# Patient Record
Sex: Male | Born: 1945
Health system: Southern US, Community
[De-identification: ages and names within clinical notes are randomized; demographics above are authoritative.]

## PROBLEM LIST (undated history)

## (undated) DIAGNOSIS — R351 Nocturia: Secondary | ICD-10-CM

## (undated) DIAGNOSIS — I10 Essential (primary) hypertension: Secondary | ICD-10-CM

## (undated) DIAGNOSIS — E785 Hyperlipidemia, unspecified: Secondary | ICD-10-CM

## (undated) DIAGNOSIS — Z973 Presence of spectacles and contact lenses: Secondary | ICD-10-CM

## (undated) DIAGNOSIS — K219 Gastro-esophageal reflux disease without esophagitis: Secondary | ICD-10-CM

## (undated) DIAGNOSIS — C61 Malignant neoplasm of prostate: Secondary | ICD-10-CM

## (undated) HISTORY — PX: PROSTATE CRYOABLATION: SUR358

## (undated) HISTORY — PX: COLONOSCOPY: SHX174

---

## 1968-07-01 HISTORY — PX: APPENDECTOMY: SHX54

## 2012-07-01 HISTORY — PX: OTHER SURGICAL HISTORY: SHX169

## 2014-07-18 ENCOUNTER — Other Ambulatory Visit: Payer: Self-pay | Admitting: Nephrology

## 2014-07-18 DIAGNOSIS — N183 Chronic kidney disease, stage 3 unspecified: Secondary | ICD-10-CM

## 2014-07-26 ENCOUNTER — Ambulatory Visit
Admission: RE | Admit: 2014-07-26 | Discharge: 2014-07-26 | Disposition: A | Discharge status: Home / Self Care | Payer: Commercial Managed Care - HMO | Source: Ambulatory Visit | Attending: Nephrology | Admitting: Nephrology

## 2014-07-26 DIAGNOSIS — N183 Chronic kidney disease, stage 3 unspecified: Secondary | ICD-10-CM

## 2014-11-15 ENCOUNTER — Other Ambulatory Visit: Payer: Self-pay | Admitting: Urology

## 2014-11-25 ENCOUNTER — Encounter (HOSPITAL_BASED_OUTPATIENT_CLINIC_OR_DEPARTMENT_OTHER): Payer: Self-pay | Admitting: *Deleted

## 2014-11-30 ENCOUNTER — Encounter (HOSPITAL_BASED_OUTPATIENT_CLINIC_OR_DEPARTMENT_OTHER): Payer: Self-pay | Admitting: *Deleted

## 2014-11-30 NOTE — Progress Notes (Signed)
NPO AFTER MN.  ARRIVE AT 0800.  NEEDS ISTAT AND EKG.

## 2014-12-04 NOTE — H&P (Signed)
Reason For Visit Consult to discuss cryotherapy   Active Problems Problems  1. 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 Rising PSA level (R97.2) 3. History of Surgery Prostate Cryosurgical Ablation  History of Present Illness     69 yo male patient of Dr. Simone Curia presents today for consult to discuss cryotherapy for hx of recurrent prostate cancer.       Adenocarcinoma prostate: His PSA was 12 and in 2010 he was diagnosed with prostate cancer and treated with XRT in [place] , [place]. . In 2013 he had a PSA failure and a 9/14 his PSA had risen to 2.9. A biopsy was performed at that time that revealed Gleason 3+4 = 7 cancer in the right lobe only. A CT scan and bone scan in 2/15 were negative for evidence of metastatic disease and an MRI scan revealed no focal extension and apparent involvement of only the right lobe. His PSA at that time was 6.35 and he underwent cryotherapy on 08/24/13 of the right lobe of his prostate only to try to preserve erectile function. He said he does still get a good erection but is not quite as good as it was when he was younger. He does not have any incontinence but he does have some postvoid dribbling.    TRUS/BX 09/29/14: His prostate only measured 22 cc. I did note calcifications within the prostate but the capsule appeared intact.  Pathology: Adenocarcinoma Gleason score 4+3 = 7, <5% right posterior lateral and Gleason 4+4 = 8, 10% left mid lateral.     PSAs:  2/15 - 6.35  4/15 - 2.27  6/15 - 2.10  10/15 - 2.7  1/16 - 3.2   Past Medical History Problems  1. History of hypercholesterolemia (Z86.39) 2. History of hypertension (Z86.79) 3. Malignant neoplasm of prostate (C61) 4. History of Radiation Therapy  Surgical History Problems  1. History of Biopsy Of The Prostate Needle 2. History of Surgery Prostate Cryosurgical Ablation  Current Meds 1. Gemfibrozil 600 MG Oral Tablet;  Therapy: (Recorded:02Mar2016) to Recorded 2.  Levofloxacin 500 MG Oral Tablet; 1 tablet the day before the procedure, 1 tablet the day of  the procedure, and 1 tablet the day after the procedure;  Therapy: [id number] to (Last Rx:02Mar2016)  Requested for: [id number] Ordered 3. Lisinopril TABS;  Therapy: (Recorded:02Mar2016) to Recorded  Allergies Medication  1. No Known Drug Allergies  Family History Problems  1. Family history of malignant neoplasm (Z80.9) : Mother  Social History Problems    Alcohol use (Z78.9)   Denied: History of Caffeine use   Former smoker [id number])   Retired   Single  Review of Systems Genitourinary system(s) were reviewed and pertinent findings if present are noted and are otherwise negative.  Genitourinary: urinary frequency, nocturia, incontinence, difficulty starting the urinary stream, weak urinary stream, urinary stream starts and stops and initiating urination requires straining.    Vitals Vital Signs [Data Includes: Last 1 Day]  Recorded: [id number] 04:21PM  Blood Pressure: 107 / 70 Temperature: 98.3 F Heart Rate: 92  Physical Exam Constitutional: Well nourished and well developed . No acute distress.  ENT:. The ears and nose are normal in appearance.  Neck: The appearance of the neck is normal and no neck mass is present.  Pulmonary: No respiratory distress and normal respiratory rhythm and effort.  Cardiovascular: Heart rate and rhythm are normal . No peripheral edema.  Abdomen: The abdomen is soft and nontender. No masses are palpated. No CVA tenderness. No hernias are  palpable. No hepatosplenomegaly noted.  Rectal: Rectal exam demonstrates normal sphincter tone. Estimated prostate size is 1+. Normal rectal tone, no rectal masses, prostate is smooth, symmetric and non-tender.  Genitourinary: Examination of the penis demonstrates no discharge, no masses, no lesions and a normal meatus. The scrotum is without lesions. The right epididymis is palpably normal and non-tender. The left  epididymis is palpably normal and non-tender. The right testis is non-tender and without masses. The left testis is non-tender and without masses.  Lymphatics: The femoral and inguinal nodes are not enlarged or tender.  Skin: Normal skin turgor, no visible rash and no visible skin lesions.  Neuro/Psych:. Mood and affect are appropriate.    Results/Data Selected Results  UA With REFLEX 07Apr2016 11:10AM Kathie Rhodes  SPECIMEN TYPE: CLEAN CATCH   Test Name Result Flag Reference  COLOR YELLOW  YELLOW  APPEARANCE CLEAR  CLEAR  SPECIFIC GRAVITY 1.015  1.005-1.030  pH 5.5  5.0-8.0  GLUCOSE NEG mg/dL  NEG  BILIRUBIN NEG  NEG  KETONE NEG mg/dL  NEG  BLOOD MOD A NEG  PROTEIN NEG mg/dL  NEG  UROBILINOGEN 0.2 mg/dL  0.0-1.0  NITRITE NEG  NEG  LEUKOCYTE ESTERASE NEG  NEG  SQUAMOUS EPITHELIAL/HPF RARE  RARE  WBC 0-2 WBC/hpf  <3  RBC 3-6 RBC/hpf A <3  BACTERIA NONE SEEN  RARE  CRYSTALS NONE SEEN  NONE SEEN  CASTS NONE SEEN  NONE SEEN   Assessment Assessed  1. 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 Rising PSA level (R97.2) 3. History of Surgery Prostate Cryosurgical Ablation  Recurrent PSA elevation. He has biochemical reactivation of disease. He has Gleason 4+4=8 CaP, in the L lateral lobe. He received cryotherapy on the Right side only, in order to save his erections.    We have discussed his prostate cancer and his alternatives for Rx. He wants to have repeat cryotherapy, understanding that he will lose his ability to have spontaneous erections. He is post Radiation therapy, and there is some concern for fistula formation. However, alternative would be more radiation, which he has already failed.   Plan Outpatient repeat cryotherapy of small gland.   Discussion/Summary cc: Dr. Lyman Speller Electronically signed by : Carolan Clines, M.D.; Nov 14 2014  4:56PM EST

## 2014-12-05 ENCOUNTER — Encounter (HOSPITAL_BASED_OUTPATIENT_CLINIC_OR_DEPARTMENT_OTHER): Payer: Self-pay | Admitting: Anesthesiology

## 2014-12-05 ENCOUNTER — Ambulatory Visit (HOSPITAL_BASED_OUTPATIENT_CLINIC_OR_DEPARTMENT_OTHER): Payer: Medicare HMO | Admitting: Anesthesiology

## 2014-12-05 ENCOUNTER — Encounter (HOSPITAL_COMMUNITY)
Admission: RE | Disposition: A | Discharge status: Home / Self Care | Payer: Self-pay | Source: Ambulatory Visit | Attending: Urology

## 2014-12-05 ENCOUNTER — Observation Stay (HOSPITAL_BASED_OUTPATIENT_CLINIC_OR_DEPARTMENT_OTHER)
Admission: RE | Admit: 2014-12-05 | Discharge: 2014-12-06 | Disposition: A | Discharge status: Home / Self Care | Payer: Medicare HMO | Source: Ambulatory Visit | Attending: Urology | Admitting: Urology

## 2014-12-05 DIAGNOSIS — Z79899 Other long term (current) drug therapy: Secondary | ICD-10-CM | POA: Insufficient documentation

## 2014-12-05 DIAGNOSIS — Z923 Personal history of irradiation: Secondary | ICD-10-CM | POA: Diagnosis not present

## 2014-12-05 DIAGNOSIS — C61 Malignant neoplasm of prostate: Secondary | ICD-10-CM | POA: Diagnosis not present

## 2014-12-05 DIAGNOSIS — I10 Essential (primary) hypertension: Secondary | ICD-10-CM | POA: Insufficient documentation

## 2014-12-05 DIAGNOSIS — Z87891 Personal history of nicotine dependence: Secondary | ICD-10-CM | POA: Insufficient documentation

## 2014-12-05 DIAGNOSIS — E780 Pure hypercholesterolemia: Secondary | ICD-10-CM | POA: Insufficient documentation

## 2014-12-05 HISTORY — DX: Hyperlipidemia, unspecified: E78.5

## 2014-12-05 HISTORY — DX: Gastro-esophageal reflux disease without esophagitis: K21.9

## 2014-12-05 HISTORY — PX: CRYOABLATION: SHX1415

## 2014-12-05 HISTORY — DX: Malignant neoplasm of prostate: C61

## 2014-12-05 HISTORY — DX: Presence of spectacles and contact lenses: Z97.3

## 2014-12-05 HISTORY — DX: Essential (primary) hypertension: I10

## 2014-12-05 HISTORY — DX: Nocturia: R35.1

## 2014-12-05 LAB — POCT I-STAT 4, (NA,K, GLUC, HGB,HCT)
Glucose, Bld: 78 mg/dL (ref 65–99)
HCT: 44 % (ref 39.0–52.0)
Hemoglobin: 15 g/dL (ref 13.0–17.0)
POTASSIUM: 4.1 mmol/L (ref 3.5–5.1)
Sodium: 140 mmol/L (ref 135–145)

## 2014-12-05 SURGERY — CRYOABLATION, PROSTATE
Anesthesia: General | Site: Prostate

## 2014-12-05 MED ORDER — CEFAZOLIN SODIUM-DEXTROSE 2-3 GM-% IV SOLR
INTRAVENOUS | Status: AC
  Filled 2014-12-05: qty 50

## 2014-12-05 MED ORDER — ONDANSETRON HCL 4 MG/2ML IJ SOLN
INTRAMUSCULAR | Status: DC | PRN
  Administered 2014-12-05: 4 mg via INTRAVENOUS

## 2014-12-05 MED ORDER — TRAMADOL HCL 50 MG PO TABS
50.0000 mg | ORAL_TABLET | Freq: Four times a day (QID) | ORAL | Status: DC | PRN
  Administered 2014-12-05: 50 mg via ORAL
  Filled 2014-12-05: qty 1

## 2014-12-05 MED ORDER — DEXAMETHASONE SODIUM PHOSPHATE 10 MG/ML IJ SOLN
INTRAMUSCULAR | Status: DC | PRN
  Administered 2014-12-05: 10 mg via INTRAVENOUS

## 2014-12-05 MED ORDER — MEPERIDINE HCL 25 MG/ML IJ SOLN: 6.2500 mg | INTRAMUSCULAR | Status: DC | PRN

## 2014-12-05 MED ORDER — HYDROMORPHONE HCL 1 MG/ML IJ SOLN: 0.2500 mg | INTRAMUSCULAR | Status: AC | PRN

## 2014-12-05 MED ORDER — DIPHENHYDRAMINE HCL 50 MG/ML IJ SOLN: 12.5000 mg | Freq: Four times a day (QID) | INTRAMUSCULAR | Status: DC | PRN

## 2014-12-05 MED ORDER — PHENAZOPYRIDINE HCL 200 MG PO TABS: 200.0000 mg | ORAL_TABLET | Freq: Three times a day (TID) | ORAL | Status: DC | PRN

## 2014-12-05 MED ORDER — FENTANYL CITRATE (PF) 100 MCG/2ML IJ SOLN
INTRAMUSCULAR | Status: AC
  Filled 2014-12-05: qty 8

## 2014-12-05 MED ORDER — TRIMETHOPRIM 100 MG PO TABS: 100.0000 mg | ORAL_TABLET | ORAL | Status: DC

## 2014-12-05 MED ORDER — TRAMADOL HCL 50 MG PO TABS
ORAL_TABLET | ORAL | Status: AC
  Filled 2014-12-05: qty 1

## 2014-12-05 MED ORDER — ONDANSETRON HCL 4 MG/2ML IJ SOLN: 4.0000 mg | Freq: Once | INTRAMUSCULAR | Status: AC | PRN

## 2014-12-05 MED ORDER — OXYBUTYNIN CHLORIDE 5 MG PO TABS: 5.0000 mg | ORAL_TABLET | Freq: Three times a day (TID) | ORAL | Status: DC | PRN

## 2014-12-05 MED ORDER — TRAMADOL-ACETAMINOPHEN 37.5-325 MG PO TABS: 1.0000 | ORAL_TABLET | Freq: Four times a day (QID) | ORAL | Status: DC | PRN

## 2014-12-05 MED ORDER — FENTANYL CITRATE (PF) 100 MCG/2ML IJ SOLN
INTRAMUSCULAR | Status: DC | PRN
  Administered 2014-12-05: 50 ug via INTRAVENOUS
  Administered 2014-12-05 (×6): 25 ug via INTRAVENOUS

## 2014-12-05 MED ORDER — STERILE WATER FOR IRRIGATION IR SOLN
Status: DC | PRN
  Administered 2014-12-05: 3000 mL

## 2014-12-05 MED ORDER — LIDOCAINE HCL (CARDIAC) 20 MG/ML IV SOLN
INTRAVENOUS | Status: DC | PRN
  Administered 2014-12-05: 100 mg via INTRAVENOUS

## 2014-12-05 MED ORDER — PHENAZOPYRIDINE HCL 100 MG PO TABS
ORAL_TABLET | ORAL | Status: AC
  Filled 2014-12-05: qty 2

## 2014-12-05 MED ORDER — CEFAZOLIN SODIUM-DEXTROSE 2-3 GM-% IV SOLR
2.0000 g | INTRAVENOUS | Status: AC
  Administered 2014-12-05: 2 g via INTRAVENOUS
  Filled 2014-12-05: qty 50

## 2014-12-05 MED ORDER — KETOROLAC TROMETHAMINE 30 MG/ML IJ SOLN
INTRAMUSCULAR | Status: DC | PRN
  Administered 2014-12-05: 30 mg via INTRAVENOUS

## 2014-12-05 MED ORDER — MIDAZOLAM HCL 5 MG/5ML IJ SOLN
INTRAMUSCULAR | Status: DC | PRN
  Administered 2014-12-05 (×2): 1 mg via INTRAVENOUS

## 2014-12-05 MED ORDER — DIPHENHYDRAMINE HCL 12.5 MG/5ML PO ELIX: 12.5000 mg | ORAL_SOLUTION | Freq: Four times a day (QID) | ORAL | Status: DC | PRN

## 2014-12-05 MED ORDER — LACTATED RINGERS IV SOLN
INTRAVENOUS | Status: DC
  Administered 2014-12-05 (×3): via INTRAVENOUS
  Filled 2014-12-05: qty 1000

## 2014-12-05 MED ORDER — SENNA 8.6 MG PO TABS
1.0000 | ORAL_TABLET | Freq: Two times a day (BID) | ORAL | Status: DC
  Administered 2014-12-05 – 2014-12-06 (×2): 8.6 mg via ORAL
  Filled 2014-12-05 (×2): qty 1

## 2014-12-05 MED ORDER — KETOROLAC TROMETHAMINE 15 MG/ML IJ SOLN: 15.0000 mg | Freq: Four times a day (QID) | INTRAMUSCULAR | Status: DC

## 2014-12-05 MED ORDER — EPHEDRINE SULFATE 50 MG/ML IJ SOLN
INTRAMUSCULAR | Status: DC | PRN
  Administered 2014-12-05 (×2): 10 mg via INTRAVENOUS

## 2014-12-05 MED ORDER — SODIUM CHLORIDE 0.45 % IV SOLN: INTRAVENOUS | Status: DC

## 2014-12-05 MED ORDER — CIPROFLOXACIN HCL 250 MG PO TABS
250.0000 mg | ORAL_TABLET | Freq: Two times a day (BID) | ORAL | Status: DC
  Administered 2014-12-05 – 2014-12-06 (×2): 250 mg via ORAL
  Filled 2014-12-05 (×2): qty 1

## 2014-12-05 MED ORDER — TAMSULOSIN HCL 0.4 MG PO CAPS: 0.4000 mg | ORAL_CAPSULE | Freq: Every day | ORAL | Status: DC

## 2014-12-05 MED ORDER — METOCLOPRAMIDE HCL 5 MG/ML IJ SOLN
INTRAMUSCULAR | Status: DC | PRN
  Administered 2014-12-05: 10 mg via INTRAVENOUS

## 2014-12-05 MED ORDER — BELLADONNA ALKALOIDS-OPIUM 16.2-60 MG RE SUPP
RECTAL | Status: DC | PRN
  Administered 2014-12-05: 1 via RECTAL

## 2014-12-05 MED ORDER — PROPOFOL 10 MG/ML IV BOLUS
INTRAVENOUS | Status: DC | PRN
  Administered 2014-12-05: 280 mg via INTRAVENOUS

## 2014-12-05 MED ORDER — MIDAZOLAM HCL 2 MG/2ML IJ SOLN
INTRAMUSCULAR | Status: AC
  Filled 2014-12-05: qty 2

## 2014-12-05 MED ORDER — ACETAMINOPHEN 325 MG PO TABS: 650.0000 mg | ORAL_TABLET | ORAL | Status: DC | PRN

## 2014-12-05 MED ORDER — BELLADONNA ALKALOIDS-OPIUM 16.2-60 MG RE SUPP
RECTAL | Status: AC
Start: 2014-12-05 — End: 2014-12-05
  Filled 2014-12-05: qty 1

## 2014-12-05 MED ORDER — PHENAZOPYRIDINE HCL 200 MG PO TABS
200.0000 mg | ORAL_TABLET | Freq: Three times a day (TID) | ORAL | Status: DC
  Administered 2014-12-05 – 2014-12-06 (×4): 200 mg via ORAL
  Filled 2014-12-05 (×6): qty 1

## 2014-12-05 MED ORDER — BACITRACIN-NEOMYCIN-POLYMYXIN 400-5-5000 EX OINT: 1.0000 "application " | TOPICAL_OINTMENT | Freq: Three times a day (TID) | CUTANEOUS | Status: DC | PRN

## 2014-12-05 MED ORDER — ACETAMINOPHEN 10 MG/ML IV SOLN
INTRAVENOUS | Status: DC | PRN
  Administered 2014-12-05: 1000 mg via INTRAVENOUS

## 2014-12-05 SURGICAL SUPPLY — 40 items
BAG URINE DRAINAGE (UROLOGICAL SUPPLIES) ×3 IMPLANT
BAG URINE LEG 19OZ MD ST LTX (BAG) IMPLANT
BLADE CLIPPER SURG (BLADE) ×3 IMPLANT
BNDG CONFORM 3 STRL LF (GAUZE/BANDAGES/DRESSINGS) IMPLANT
BOOTIES KNEE HIGH SLOAN (MISCELLANEOUS) ×3 IMPLANT
CANISTER SUCTION 2500CC (MISCELLANEOUS) IMPLANT
CATH FOLEY 2WAY SLVR  5CC 18FR (CATHETERS) ×2
CATH FOLEY 2WAY SLVR 5CC 18FR (CATHETERS) ×1 IMPLANT
CHARGE TECH PROCEDURE ONCURA (LABOR (TRAVEL & OVERTIME)) ×3 IMPLANT
CLOTH BEACON ORANGE TIMEOUT ST (SAFETY) ×3 IMPLANT
COVER BACK TABLE 60X90IN (DRAPES) IMPLANT
COVER MAYO STAND STRL (DRAPES) ×3 IMPLANT
DRAPE INCISE IOBAN 66X45 STRL (DRAPES) ×3 IMPLANT
DRAPE LG THREE QUARTER DISP (DRAPES) ×3 IMPLANT
DRAPE UNDERBUTTOCKS STRL (DRAPE) ×3 IMPLANT
DRSG TEGADERM 4X4.75 (GAUZE/BANDAGES/DRESSINGS) ×3 IMPLANT
DRSG TEGADERM 8X12 (GAUZE/BANDAGES/DRESSINGS) ×3 IMPLANT
GAS ARGON HIGH PRESSURE (MEDICAL GASES) ×3 IMPLANT
GAS HELIUM HIGH PRESSURE (MEDICAL GASES) ×3 IMPLANT
GAUZE SPONGE 4X4 12PLY STRL (GAUZE/BANDAGES/DRESSINGS) IMPLANT
GLOVE BIO SURGEON STRL SZ 6.5 (GLOVE) ×2 IMPLANT
GLOVE BIO SURGEON STRL SZ7.5 (GLOVE) ×3 IMPLANT
GLOVE BIO SURGEONS STRL SZ 6.5 (GLOVE) ×1
GLOVE BIOGEL PI IND STRL 6.5 (GLOVE) ×1 IMPLANT
GLOVE BIOGEL PI INDICATOR 6.5 (GLOVE) ×2
GOWN STRL REUS W/TWL LRG LVL3 (GOWN DISPOSABLE) ×3 IMPLANT
GOWN STRL REUS W/TWL XL LVL3 (GOWN DISPOSABLE) ×3 IMPLANT
GUIDEWIRE SUPER STIFF (WIRE) ×3 IMPLANT
HOLDER FOLEY CATH W/STRAP (MISCELLANEOUS) ×3 IMPLANT
KIT CRYO ENDOCARE (DISPOSABLE) ×3 IMPLANT
KIT ICE ROD PROCEDURE PRECISE (DISPOSABLE) IMPLANT
KIT PROSTATE PRESICE I (KITS) IMPLANT
NEEDLE SPNL 18GX3.5 QUINCKE PK (NEEDLE) IMPLANT
PACK CYSTO (CUSTOM PROCEDURE TRAY) ×3 IMPLANT
PLUG CATH AND CAP STER (CATHETERS) ×3 IMPLANT
SPONGE GAUZE 4X4 12PLY STER LF (GAUZE/BANDAGES/DRESSINGS) ×3 IMPLANT
SYRINGE 10CC LL (SYRINGE) IMPLANT
SYRINGE IRR TOOMEY STRL 70CC (SYRINGE) IMPLANT
UNDERPAD 30X30 INCONTINENT (UNDERPADS AND DIAPERS) ×3 IMPLANT
WATER STERILE IRR 500ML POUR (IV SOLUTION) ×3 IMPLANT

## 2014-12-05 NOTE — H&P (View-Only) (Signed)
NPO AFTER MN.  ARRIVE AT 0800.  NEEDS ISTAT AND EKG.

## 2014-12-05 NOTE — Progress Notes (Signed)
Anesthesia was contacted at 4pm all attempts to get a ride home was tried with no one available.  There was a plan for his daughter to pick up that did not pan out.  Dr.Denenny stated that the patient can not be released to go home in a taxi.Dr.Tannenbaum was contacted and stated that he would admit the patient for overnight.  The patients ex-wife was notified that he would be admitted and the patient spoke with her.patient was taken to room 1436 for overnight stay.

## 2014-12-05 NOTE — Interval H&P Note (Signed)
History and Physical Interval Note:  12/05/2014 9:45 AM  James [name]  has presented today for surgery, with the diagnosis of RECURENT PROSTATE CANCER  The various methods of treatment have been discussed with the patient and family. After consideration of risks, benefits and other options for treatment, the patient has consented to  Procedure(s): CRYO ABLATION PROSTATE (N/A) as a surgical intervention .  The patient's history has been reviewed, patient examined, no change in status, stable for surgery.  I have reviewed the patient's chart and labs.  Questions were answered to the patient's satisfaction.     Carilyn Woolston I Colum Colt

## 2014-12-05 NOTE — Anesthesia Preprocedure Evaluation (Signed)
Anesthesia Evaluation  Patient identified by MRN, date of birth, ID band Patient awake    Reviewed: Allergy & Precautions, NPO status , Patient's Chart, lab work & pertinent test results  Airway Mallampati: I  TM Distance: >3 FB Neck ROM: Full    Dental   Pulmonary former smoker,    Pulmonary exam normal       Cardiovascular hypertension, Pt. on medications Normal cardiovascular exam    Neuro/Psych    GI/Hepatic GERD-  Controlled and Medicated,  Endo/Other    Renal/GU      Musculoskeletal   Abdominal   Peds  Hematology   Anesthesia Other Findings   Reproductive/Obstetrics                             Anesthesia Physical Anesthesia Plan  ASA: II  Anesthesia Plan: General   Post-op Pain Management:    Induction: Intravenous  Airway Management Planned: LMA  Additional Equipment:   Intra-op Plan:   Post-operative Plan: Extubation in OR  Informed Consent: I have reviewed the patients History and Physical, chart, labs and discussed the procedure including the risks, benefits and alternatives for the proposed anesthesia with the patient or authorized representative who has indicated his/her understanding and acceptance.     Plan Discussed with: CRNA and Surgeon  Anesthesia Plan Comments:         Anesthesia Quick Evaluation

## 2014-12-05 NOTE — Progress Notes (Signed)
Patients daughter was called to pick up patient.  Her mother communicated the information to his daughter.  She was not here to take him home.  Trying to find a ride home.  Daughter unable come before 5:30 or 6:00 if then.

## 2014-12-05 NOTE — Anesthesia Postprocedure Evaluation (Signed)
Anesthesia Post Note  Patient: James [name]  Procedure(s) Performed: Procedure(s) (LRB): CRYO ABLATION PROSTATE (N/A)  Anesthesia type: general  Patient location: PACU  Post pain: Pain level controlled  Post assessment: Patient's Cardiovascular Status Stable  Last Vitals:  Filed Vitals:   12/05/14 1215  BP: 152/76  Pulse: 66  Temp:   Resp: 12    Post vital signs: Reviewed and stable  Level of consciousness: sedated  Complications: No apparent anesthesia complications

## 2014-12-05 NOTE — Anesthesia Procedure Notes (Signed)
Procedure Name: LMA Insertion Date/Time: 12/05/2014 10:01 AM Performed by: Justice Rocher Pre-anesthesia Checklist: Patient identified, Emergency Drugs available, Suction available and Patient being monitored Patient Re-evaluated:Patient Re-evaluated prior to inductionOxygen Delivery Method: Circle System Utilized Preoxygenation: Pre-oxygenation with 100% oxygen Intubation Type: IV induction Ventilation: Mask ventilation without difficulty LMA: LMA inserted LMA Size: 5.0 Number of attempts: 1 Airway Equipment and Method: Bite block Placement Confirmation: positive ETCO2 Tube secured with: Tape Dental Injury: Teeth and Oropharynx as per pre-operative assessment

## 2014-12-05 NOTE — Transfer of Care (Signed)
Immediate Anesthesia Transfer of Care Note  Patient: James [name]  Procedure(s) Performed: Procedure(s) (LRB): CRYO ABLATION PROSTATE (N/A)  Patient Location: PACU  Anesthesia Type: General  Level of Consciousness: awake, sedated, patient cooperative and responds to stimulation  Airway & Oxygen Therapy: Patient Spontanous Breathing and Patient connected to face mask oxygen  Post-op Assessment: Report given to PACU RN, Post -op Vital signs reviewed and stable and Patient moving all extremities  Post vital signs: Reviewed and stable  Complications: No apparent anesthesia complications

## 2014-12-05 NOTE — Discharge Instructions (Addendum)
INSTRUCTIONS AFTER CRYOABLATION OF THE PROSTATE  Normal Findings After Cryoablation:   You may experience a number of symptoms after the cryoablation which occur in  some patients.  There is no cause for alarm should this happen.  These    symptoms include:  -Blood in the urine.  When you are discharged after your surgery, your urine  will have some blood in it and may appear red.  This occurs to some degree in all patients after cryoablation and is not cause for alarm.  Your urine should clear approximately 24 hours after the procedure, but may persist for quite a while longer.  -Scrotal and penile swelling and bruising.  This occurs about 2-3 days after  the cryoablation and is caused by tissue swelling which temporarily blocks the drainage of lymph.  This is painless and resolves in less than one week.  Ice packs and lying down for short periods of time during the day will improve the swelling.  -Small amounts of bloody discharge from the end of your penis.  This can  occur for up to 6 weeks after the procedure and is not cause for alarm.  It is due to some discharge from the urethra in the area of the prostate.  -Some numbness in the head of the penis.  Occasionally, when a large amount of freezing has been performed during the procedure, the nerve which supplies sensation to one or both sides of the head of the penis may be affected. The sensation returns after a number of months.  Call your doctor at [phone] if any of the following occur:               -If you have any pain, fever, or chills.  -If your foley catheter or suprapubic tube is not draining urine.  -If there is decreasing urinary stream.  This may indicate sloughing of some  dead tissue in the area of the prostate near the opening to the bladder.  It may clear on its own but,  if the problem becomes severe, it may require removal of the dead tissue through a cystoscope.  -If you have diarrhea after urination or foul-smelling  urine.  These  Symptoms may indicate an urethrorectal fistula, which is a hole between the bladder channel and the rectum.  This should be investigated by your doctor.  -If you have any questions or problems.   Diet:  Resume your normal diet.   If you become constipated, you may try  over-the-counter remedies such as Milk of Magnesia.  If you are nauseated, vomiting or feel bloated, notify your doctor's office.  DO NOT GIVE YOURSELF ANY ENEMAS OR RECTAL MEDICATIONS.  THE RECTAL WALL IS THIN AFTER CRYOABLATION.  Activity:   -You may have swelling and bruising of the penis and scrotum.  Apply ice packs   to the area behind the scrotum intermittently for 24-48 hours after your surgery to keep the swelling down.  Wearing an athletic supporter (jock strap) might also help.  -Lying flat on your back will also help decrease the swelling.  You may find   when you are sitting up or walking around that the swelling increases.   -You will have  puncture wounds behind your scrotum making it painful to sit   down.  Use a hemorrhoid donut to sit on if needed.  -You may shower on the second day after surgery.  -There are no lifting or driving restrictions.  Use caution while the suprapubic tube  is in place.   Medications:  -You may resume your preoperative medications, except aspirin and other blood   thinning agents.  Unless otherwise instructed, resume your aspirin after your suprapubic tube is removed.  If you are taking Coumadin or other blood thinners, please discuss when to restart these medications with your surgeon.  -Unless otherwise ordered, you will be given prescriptions for the following   medications which you will need to take after your procedure:   *Antibiotics -  to prevent infection while your suprapubic tube is in place.   *Anti-inflammatory - to prevent pain and to decrease the inflammation in  the area of the prostate.  You may take ibuprofen (Motrin, Advil),acetaminophen (Tylenol) or  naproxen (Aleve) as needed for discomfort.   Call your doctor's office at [phone] for an appointment.  Patient Signature:  ________________________________________________________  Nurse's Signature:  ________________________________________________________    Post Anesthesia Home Care Instructions  Activity: Get plenty of rest for the remainder of the day. A responsible adult should stay with you for 24 hours following the procedure.  For the next 24 hours, DO NOT: -Drive a car -Paediatric nurse -Drink alcoholic beverages -Take any medication unless instructed by your physician -Make any legal decisions or sign important papers.  Meals: Start with liquid foods such as gelatin or soup. Progress to regular foods as tolerated. Avoid greasy, spicy, heavy foods. If nausea and/or vomiting occur, drink only clear liquids until the nausea and/or vomiting subsides. Call your physician if vomiting continues.  Special Instructions/Symptoms: Your throat may feel dry or sore from the anesthesia or the breathing tube placed in your throat during surgery. If this causes discomfort, gargle with warm salt water. The discomfort should disappear within 24 hours.  If you had a scopolamine patch placed behind your ear for the management of post- operative nausea and/or vomiting:  1. The medication in the patch is effective for 72 hours, after which it should be removed.  Wrap patch in a tissue and discard in the trash. Wash hands thoroughly with soap and water. 2. You may remove the patch earlier than 72 hours if you experience unpleasant side effects which may include dry mouth, dizziness or visual disturbances. 3. Avoid touching the patch. Wash your hands with soap and water after contact with the patch.

## 2014-12-05 NOTE — Op Note (Signed)
Pre-operative diagnosis :   Recurrent Adenocarcinoma Prostate  Postoperative diagnosis: Recurrent  Adenocarcinoma Prostate  Operation: Cryotherapy of the Prostate  Surgeon:  S. Gaynelle Arabian, MD  First assistant:  None  Anesthesia: General LMA  Preparation:  After appropriate preanesthesia, the James [name] was brought to the operative room, placed on the operating table in the dorsal supine position where general LMA anesthesia was introduced. He was then replaced in the dorsal lithotomy position with the pubis was prepped with Betadine solution and draped in usual fashion. The history was reviewed. The armband was double checked.  Review history:  Active Problems Problems  1. 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 Rising PSA level (R97.2) 3. History of Surgery Prostate Cryosurgical Ablation  History of Present Illness    69 yo male James [name] of Dr. Simone Curia presents today for consult to discuss cryotherapy for hx of recurrent prostate cancer.     Adenocarcinoma prostate: His PSA was 12 and in 2010 he was diagnosed with prostate cancer and treated with XRT in [place] , [place]. . In 2013 he had a PSA failure and a 9/14 his PSA had risen to 2.9. A biopsy was performed at that time that revealed Gleason 3+4 = 7 cancer in the right lobe only. A CT scan and bone scan in 2/15 were negative for evidence of metastatic disease and an MRI scan revealed no focal extension and apparent involvement of only the right lobe. His PSA at that time was 6.35 and he underwent cryotherapy on 08/24/13 of the right lobe of his prostate only to try to preserve erectile function. He said he does still get a good erection but is not quite as good as it was when he was younger. He does not have any incontinence but he does have some postvoid dribbling.    TRUS/BX 09/29/14: His prostate only measured 22 cc. I did note calcifications within the prostate but the capsule appeared intact.  Pathology: Adenocarcinoma  Gleason score 4+3 = 7, <5% right posterior lateral and Gleason 4+4 = 8, James% left mid lateral.  Statement of  Likelihood of Success: Excellent. TIME-OUT observed.:  Procedure:  With the transrectal ultrasound probe placed, multiple cryotherapy probes were placed in the prostate, in order to ensure that 5 different levels of cryotherapy could be obtained and individually treated.  Temperature probes were placed in the perirectal fascia and an Denonvilliers' fascia. Cystourethroscopy with a copy is with the Flexible cystoscope, to ensure that no probes penetrating the urethra or the bladder. Following cystoscopy, a 0.038 guidewire was passed through the scope into the bladder and coiled. Over the wire, a urethral warming device was placed under ultrasound control. The wire was removed, and the  warming device was secured in place.  5 different rows of cryoprobes were placed, and temperature probes were placed in the perirectal fascia, and Denonvilliers' fascia. The James [name] then underwent controlled cryotherapy of the prostate, with 2 separate freeze/thaw cycles. The second cycle, used a passive thaw. The James [name] tolerated the procedure well, and was given IV Toradol, and IV Tylenol, in addition to a B and O suppository. He was awakened, and taken to recovery room in good condition. Following an extra 20 minutes of urethral warming device, the device was removed, and 44F  Foley catheter was then placed with James mL of sterile water in the balloon.  The James [name] was awakened and taken to recovery room in good condition.

## 2014-12-06 ENCOUNTER — Encounter (HOSPITAL_BASED_OUTPATIENT_CLINIC_OR_DEPARTMENT_OTHER): Payer: Self-pay | Admitting: Urology

## 2014-12-06 DIAGNOSIS — C61 Malignant neoplasm of prostate: Secondary | ICD-10-CM | POA: Diagnosis not present

## 2014-12-06 NOTE — Progress Notes (Signed)
Urology Progress Note  1 Day Post-Op  Pt admitted overnight from [facility] because no one came to pick him up. He is AF with VSS and ready for d/c today.  Subjective:     No acute urologic events overnight. Ambulation:   positive Flatus:    positive Bowel movement  positive  Pain: complete resolution  Objective:  Blood pressure 142/79, pulse 65, temperature 98.2 F (36.8 C), temperature source Oral, resp. rate 18, height 5\' 10"  (1.778 m), weight 90.946 kg (200 lb 8 oz), SpO2 98 %.  Physical Exam:  General:  No acute distress, awake  Genitourinary:  neg Foley: straight drainage    I/O last 3 completed shifts: In: 2268.3 [P.O.:480; I.V.:1788.3] Out: 1250 [Urine:1250]  Recent Labs     12/05/14  0848  HGB  15.0    Recent Labs     12/05/14  0848  NA  140  K  4.1     No results for input(s): INR, APTT in the last 72 hours.  Invalid input(s): PT   Invalid input(s): ABG  Assessment/Plan:  Continue any current medications.  OK for d/c RTC for voiding trial as per prior appointment Scripts from yesterday

## 2014-12-06 NOTE — Discharge Summary (Signed)
Physician Discharge Summary  Patient ID: James [name] MRN: [id number] DOB/AGE: Nov 07, 1945 69 y.o.  Admit date: 12/05/2014 Discharge date: 12/06/2014  Admission Diagnoses: [place] [facility]CER  Discharge Diagnoses:  Active Problems:   Prostate cancer   Discharged Condition: good  Hospital Course: Cryotherapy  Significant Diagnostic Studies: No results found.  Discharge Exam: Blood pressure 142/79, pulse 65, temperature 98.2 F (36.8 C), temperature source Oral, resp. rate 18, height 5\' 10"  (1.778 m), weight 90.946 kg (200 lb 8 oz), SpO2 98 %.   Disposition: Final discharge disposition not confirmed  Discharge Instructions    Continue foley catheter    Complete by:  As directed      Discharge patient    Complete by:  As directed      Discontinue IV    Complete by:  As directed             Medication List    TAKE these medications        calcium carbonate 500 MG chewable tablet  Commonly known as:  TUMS - dosed in mg elemental calcium  Chew 1 tablet by mouth as needed for indigestion or heartburn.     gemfibrozil 600 MG tablet  Commonly known as:  LOPID  Take 600 mg by mouth 2 (two) times daily before a meal.     lisinopril-hydrochlorothiazide 10-12.5 MG per tablet  Commonly known as:  PRINZIDE,ZESTORETIC  Take 1 tablet by mouth every morning.     phenazopyridine 200 MG tablet  Commonly known as:  PYRIDIUM  Take 1 tablet (200 mg total) by mouth 3 (three) times daily as needed for pain.     tamsulosin 0.4 MG Caps capsule  Commonly known as:  FLOMAX  Take 1 capsule (0.4 mg total) by mouth daily.     traMADol-acetaminophen 37.5-325 MG per tablet  Commonly known as:  ULTRACET  Take 1 tablet by mouth every 6 (six) hours as needed.     trimethoprim 100 MG tablet  Commonly known as:  TRIMPEX  Take 1 tablet (100 mg total) by mouth 1 day or 1 dose.           Follow-up Information    Follow up with Ailene Rud, MD.   Specialty:  Urology   Why:   per f/u appointment   Contact information:   [address] [place] [address] [phone]      Return  To office for foley cath removal.  Signed: Eduin Friedel I Veona Bittman 12/06/2014, 12:50 PM

## 2015-04-14 ENCOUNTER — Encounter (HOSPITAL_BASED_OUTPATIENT_CLINIC_OR_DEPARTMENT_OTHER): Payer: Self-pay | Admitting: Urology

## 2015-11-14 ENCOUNTER — Other Ambulatory Visit (HOSPITAL_COMMUNITY): Payer: Self-pay | Admitting: Urology

## 2015-11-14 DIAGNOSIS — C61 Malignant neoplasm of prostate: Secondary | ICD-10-CM

## 2015-11-28 ENCOUNTER — Ambulatory Visit (HOSPITAL_COMMUNITY)
Admission: RE | Admit: 2015-11-28 | Discharge: 2015-11-28 | Disposition: A | Discharge status: Home / Self Care | Payer: Medicare HMO | Source: Ambulatory Visit | Attending: Urology | Admitting: Urology

## 2015-11-28 DIAGNOSIS — C61 Malignant neoplasm of prostate: Secondary | ICD-10-CM | POA: Insufficient documentation

## 2015-11-28 DIAGNOSIS — R590 Localized enlarged lymph nodes: Secondary | ICD-10-CM | POA: Insufficient documentation

## 2015-11-28 DIAGNOSIS — R972 Elevated prostate specific antigen [PSA]: Secondary | ICD-10-CM | POA: Insufficient documentation

## 2015-11-28 LAB — POCT I-STAT CREATININE: CREATININE: 1.3 mg/dL — AB (ref 0.61–1.24)

## 2015-11-28 MED ORDER — GADOBENATE DIMEGLUMINE 529 MG/ML IV SOLN
20.0000 mL | Freq: Once | INTRAVENOUS | Status: AC | PRN
  Administered 2015-11-28: 19 mL via INTRAVENOUS

## 2015-12-01 ENCOUNTER — Ambulatory Visit (HOSPITAL_COMMUNITY)
Admission: RE | Admit: 2015-12-01 | Discharge: 2015-12-01 | Disposition: A | Discharge status: Home / Self Care | Payer: Medicare HMO | Source: Ambulatory Visit | Attending: Urology | Admitting: Urology

## 2015-12-01 DIAGNOSIS — C61 Malignant neoplasm of prostate: Secondary | ICD-10-CM | POA: Diagnosis present

## 2015-12-01 DIAGNOSIS — R938 Abnormal findings on diagnostic imaging of other specified body structures: Secondary | ICD-10-CM | POA: Insufficient documentation

## 2015-12-01 DIAGNOSIS — R599 Enlarged lymph nodes, unspecified: Secondary | ICD-10-CM | POA: Diagnosis not present

## 2015-12-01 DIAGNOSIS — J929 Pleural plaque without asbestos: Secondary | ICD-10-CM | POA: Diagnosis not present

## 2015-12-01 LAB — GLUCOSE, CAPILLARY: GLUCOSE-CAPILLARY: 86 mg/dL (ref 65–99)

## 2015-12-01 MED ORDER — FLUDEOXYGLUCOSE F - 18 (FDG) INJECTION
9.4200 | Freq: Once | INTRAVENOUS | Status: AC | PRN
  Administered 2015-12-01: 9.42 via INTRAVENOUS

## 2015-12-07 ENCOUNTER — Other Ambulatory Visit (HOSPITAL_COMMUNITY): Payer: Self-pay | Admitting: Urology

## 2015-12-07 DIAGNOSIS — R599 Enlarged lymph nodes, unspecified: Secondary | ICD-10-CM

## 2015-12-14 ENCOUNTER — Other Ambulatory Visit: Payer: Self-pay | Admitting: General Surgery

## 2015-12-14 ENCOUNTER — Other Ambulatory Visit: Payer: Self-pay | Admitting: Radiology

## 2015-12-15 ENCOUNTER — Encounter (HOSPITAL_COMMUNITY): Payer: Self-pay

## 2015-12-15 ENCOUNTER — Other Ambulatory Visit (HOSPITAL_COMMUNITY): Payer: Self-pay | Admitting: Urology

## 2015-12-15 ENCOUNTER — Ambulatory Visit (HOSPITAL_COMMUNITY)
Admission: RE | Admit: 2015-12-15 | Discharge: 2015-12-15 | Disposition: A | Discharge status: Home / Self Care | Payer: Medicare HMO | Source: Ambulatory Visit | Attending: Urology | Admitting: Urology

## 2015-12-15 DIAGNOSIS — E785 Hyperlipidemia, unspecified: Secondary | ICD-10-CM | POA: Diagnosis not present

## 2015-12-15 DIAGNOSIS — R599 Enlarged lymph nodes, unspecified: Secondary | ICD-10-CM | POA: Diagnosis present

## 2015-12-15 DIAGNOSIS — R351 Nocturia: Secondary | ICD-10-CM | POA: Diagnosis not present

## 2015-12-15 DIAGNOSIS — C7982 Secondary malignant neoplasm of genital organs: Secondary | ICD-10-CM | POA: Diagnosis not present

## 2015-12-15 DIAGNOSIS — I10 Essential (primary) hypertension: Secondary | ICD-10-CM | POA: Insufficient documentation

## 2015-12-15 DIAGNOSIS — K219 Gastro-esophageal reflux disease without esophagitis: Secondary | ICD-10-CM | POA: Insufficient documentation

## 2015-12-15 DIAGNOSIS — Z87891 Personal history of nicotine dependence: Secondary | ICD-10-CM | POA: Insufficient documentation

## 2015-12-15 DIAGNOSIS — Z923 Personal history of irradiation: Secondary | ICD-10-CM | POA: Diagnosis not present

## 2015-12-15 DIAGNOSIS — C61 Malignant neoplasm of prostate: Secondary | ICD-10-CM | POA: Insufficient documentation

## 2015-12-15 LAB — CBC
HCT: 40.4 % (ref 39.0–52.0)
Hemoglobin: 14.2 g/dL (ref 13.0–17.0)
MCH: 30.9 pg (ref 26.0–34.0)
MCHC: 35.1 g/dL (ref 30.0–36.0)
MCV: 88 fL (ref 78.0–100.0)
PLATELETS: 229 10*3/uL (ref 150–400)
RBC: 4.59 MIL/uL (ref 4.22–5.81)
RDW: 13.3 % (ref 11.5–15.5)
WBC: 4.1 10*3/uL (ref 4.0–10.5)

## 2015-12-15 LAB — PROTIME-INR
INR: 1.17 (ref 0.00–1.49)
PROTHROMBIN TIME: 14.6 s (ref 11.6–15.2)

## 2015-12-15 LAB — APTT: aPTT: 36 seconds (ref 24–37)

## 2015-12-15 MED ORDER — FENTANYL CITRATE (PF) 100 MCG/2ML IJ SOLN
INTRAMUSCULAR | Status: AC
  Filled 2015-12-15: qty 4

## 2015-12-15 MED ORDER — MIDAZOLAM HCL 2 MG/2ML IJ SOLN
INTRAMUSCULAR | Status: AC | PRN
  Administered 2015-12-15 (×2): 1 mg via INTRAVENOUS

## 2015-12-15 MED ORDER — MIDAZOLAM HCL 2 MG/2ML IJ SOLN
INTRAMUSCULAR | Status: AC
  Filled 2015-12-15: qty 6

## 2015-12-15 MED ORDER — SODIUM CHLORIDE 0.9 % IV SOLN
INTRAVENOUS | Status: DC
  Administered 2015-12-15: 10:00:00 via INTRAVENOUS

## 2015-12-15 MED ORDER — FENTANYL CITRATE (PF) 100 MCG/2ML IJ SOLN
INTRAMUSCULAR | Status: AC | PRN
  Administered 2015-12-15: 50 ug via INTRAVENOUS

## 2015-12-15 NOTE — Procedures (Signed)
R iliac LN Bx Core 18 g times 2 No comp/EBL

## 2015-12-15 NOTE — Discharge Instructions (Signed)
Needle Biopsy, Care After °These instructions give you information about caring for yourself after your procedure. Your doctor may also give you more specific instructions. Call your doctor if you have any problems or questions after your procedure. °HOME CARE °· Rest as told by your doctor. °· Take medicines only as told by your doctor. °· There are many different ways to close and cover the biopsy site, including stitches (sutures), skin glue, and adhesive strips. Follow instructions from your doctor about: °¨ How to take care of your biopsy site. °¨ When and how you should change your bandage (dressing). °¨ When you should remove your dressing. °¨ Removing whatever was used to close your biopsy site. °· Check your biopsy site every day for signs of infection. Watch for: °¨ Redness, swelling, or pain. °¨ Fluid, blood, or pus. °GET HELP IF: °· You have a fever. °· You have redness, swelling, or pain at the biopsy site, and it lasts longer than a few days. °· You have fluid, blood, or pus coming from the biopsy site. °· You feel sick to your stomach (nauseous). °· You throw up (vomit). °GET HELP RIGHT AWAY IF: °· You are short of breath. °· You have trouble breathing. °· Your chest hurts. °· You feel dizzy or you pass out (faint). °· You have bleeding that does not stop with pressure or a bandage. °· You cough up blood. °· Your belly (abdomen) hurts. °  °This information is not intended to replace advice given to you by your health care provider. Make sure you discuss any questions you have with your health care provider. °  °Document Released: 05/30/2008 Document Revised: 11/01/2014 Document Reviewed: 06/13/2014 °Elsevier Interactive Patient Education ©2016 Elsevier Inc[employer] °Moderate Conscious Sedation, Adult, Care After °Refer to this sheet in the next few weeks. These instructions provide you with information on caring for yourself after your procedure. Your health care provider may also give you more specific  instructions. Your treatment has been planned according to current medical practices, but problems sometimes occur. Call your health care provider if you have any problems or questions after your procedure. °WHAT TO EXPECT AFTER THE PROCEDURE  °After your procedure: °· You may feel sleepy, clumsy, and have poor balance for several hours. °· Vomiting may occur if you eat too soon after the procedure. °HOME CARE INSTRUCTIONS °· Do not participate in any activities where you could become injured for at least 24 hours. Do not: °· Drive. °· Swim. °· Ride a bicycle. °· Operate heavy machinery. °· Cook. °· Use power tools. °· Climb ladders. °· Work from a high place. °· Do not make important decisions or sign legal documents until you are improved. °· If you vomit, drink water, juice, or soup when you can drink without vomiting. Make sure you have little or no nausea before eating solid foods. °· Only take over-the-counter or prescription medicines for pain, discomfort, or fever as directed by your health care provider. °· Make sure you and your family fully understand everything about the medicines given to you, including what side effects may occur. °· You should not drink alcohol, take sleeping pills, or take medicines that cause drowsiness for at least 24 hours. °· If you smoke, do not smoke without supervision. °· If you are feeling better, you may resume normal activities 24 hours after you were sedated. °· Keep all appointments with your health care provider. °SEEK MEDICAL CARE IF: °· Your skin is pale or bluish in color. °· You   continue to feel nauseous or vomit. °· Your pain is getting worse and is not helped by medicine. °· You have bleeding or swelling. °· You are still sleepy or feeling clumsy after 24 hours. °SEEK IMMEDIATE MEDICAL CARE IF: °· You develop a rash. °· You have difficulty breathing. °· You develop any type of allergic problem. °· You have a fever. °MAKE SURE YOU: °· Understand these  instructions. °· Will watch your condition. °· Will get help right away if you are not doing well or get worse. °  °This information is not intended to replace advice given to you by your health care provider. Make sure you discuss any questions you have with your health care provider. °  °Document Released: 04/07/2013 Document Revised: 07/08/2014 Document Reviewed: 04/07/2013 °Elsevier Interactive Patient Education ©2016 Elsevier Inc. ° °

## 2015-12-15 NOTE — H&P (Signed)
Chief Complaint: hypermetabolic right iliac LN, prostate cancer  Referring Physician:Dr. Carolan Clines  Supervising Physician: Marybelle Killings  Patient Status:Out-pt  HPI: James [name] is an 70 y.o. male with a history of recurrent prostate cancer.  He just underwent cryotherapy of his prostate a week ago.  He also had a PET scan that revealed a hypermetabolic right iliac lymph node.  He presents today for biopsy of this node.  He has no complaints.  Past Medical History:  Past Medical History  Diagnosis Date  . Hypertension   . Hyperlipidemia   . GERD (gastroesophageal reflux disease)   . Nocturia   . Recurrent prostate adenocarcinoma ([place])     first dx'd-- 2010  post external beam radiation--  recurrent march 2015  s/p cryoablation (in [place])  . Wears glasses     Past Surgical History:  Past Surgical History  Procedure Laterality Date  . Prostate cryoablation  March 2015  [facility])  . Colonoscopy  last one 2014  . Repair right ring and little fingers  2014     post numbness residual  . Cryoablation N/A 12/05/2014    Procedure: CRYO ABLATION PROSTATE;  Surgeon: Carolan Clines, MD;  Location: [facility];  Service: Urology;  Laterality: N/A;    Family History: History reviewed. No pertinent family history.  Social History:  reports that he quit smoking about 3 years ago. His smoking use included Cigarettes. He has a 40 pack-year smoking history. He has never used smokeless tobacco. He reports that he does not drink alcohol or use illicit drugs.  Allergies: No Known Allergies  Medications:   Medication List    ASK your doctor about these medications        amLODipine 10 MG tablet  Commonly known as:  NORVASC  Take 10 mg by mouth daily.     calcium carbonate 500 MG chewable tablet  Commonly known as:  TUMS - dosed in mg elemental calcium  Chew 1 tablet by mouth as needed for indigestion or heartburn.     gemfibrozil 600 MG tablet    Commonly known as:  LOPID  Take 600 mg by mouth 2 (two) times daily before a meal.     lisinopril-hydrochlorothiazide 10-12.5 MG tablet  Commonly known as:  PRINZIDE,ZESTORETIC  Take 1 tablet by mouth every morning.     phenazopyridine 200 MG tablet  Commonly known as:  PYRIDIUM  Take 1 tablet (200 mg total) by mouth 3 (three) times daily as needed for pain.     tamsulosin 0.4 MG Caps capsule  Commonly known as:  FLOMAX  Take 1 capsule (0.4 mg total) by mouth daily.     traMADol-acetaminophen 37.5-325 MG tablet  Commonly known as:  ULTRACET  Take 1 tablet by mouth every 6 (six) hours as needed.     trimethoprim 100 MG tablet  Commonly known as:  TRIMPEX  Take 1 tablet (100 mg total) by mouth 1 day or 1 dose.        Please HPI for pertinent positives, otherwise complete 10 system ROS negative.  Mallampati Score: MD Evaluation Airway: WNL Heart: WNL Abdomen: WNL Chest/ Lungs: WNL ASA  Classification: 2 Mallampati/Airway Score: Two  Physical Exam: BP 132/77 mmHg  Pulse 57  Temp(Src) 97.6 F (36.4 C) (Oral)  Resp 20  Ht 5\' 10"  (1.778 m)  Wt 187 lb (84.823 kg)  BMI 26.83 kg/m2  SpO2 100% Body mass index is 26.83 kg/(m^2). General: pleasant, WD, WN black male who  is laying in bed in NAD HEENT: head is normocephalic, atraumatic.  Sclera are noninjected.  PERRL.  Ears and nose without any masses or lesions.  Mouth is pink and moist Heart: regular, rate, and rhythm.  Normal s1,s2. No obvious murmurs, gallops, or rubs noted.  Palpable radial and pedal pulses bilaterally Lungs: CTAB, no wheezes, rhonchi, or rales noted.  Respiratory effort nonlabored Abd: soft, NT, ND, +BS, no masses, hernias, or organomegaly MS: all 4 extremities are symmetrical with no cyanosis, clubbing, or edema. Psych: A&Ox3 with an appropriate affect.   Labs: Results for orders placed or performed during the hospital encounter of 12/15/15 (from the past 48 hour(s))  APTT upon arrival     Status:  None   Collection Time: 12/15/15  9:30 AM  Result Value Ref Range   aPTT 36 24 - 37 seconds  CBC upon arrival     Status: None   Collection Time: 12/15/15  9:30 AM  Result Value Ref Range   WBC 4.1 4.0 - 10.5 K/uL   RBC 4.59 4.22 - 5.81 MIL/uL   Hemoglobin 14.2 13.0 - 17.0 g/dL   HCT 40.4 39.0 - 52.0 %   MCV 88.0 78.0 - 100.0 fL   MCH 30.9 26.0 - 34.0 pg   MCHC 35.1 30.0 - 36.0 g/dL   RDW 13.3 11.5 - 15.5 %   Platelets 229 150 - 400 K/uL  Protime-INR upon arrival     Status: None   Collection Time: 12/15/15  9:30 AM  Result Value Ref Range   Prothrombin Time 14.6 11.6 - 15.2 seconds   INR 1.17 0.00 - 1.49    Imaging: No results found.  Assessment/Plan 1. Recurrent prostate cancer with hypermetabolic right iliac LN -we will plan for core biopsy of this LN today -labs and vitals have been reviewed -Risks and Benefits discussed with the patient including, but not limited to bleeding, infection, damage to adjacent structures or low yield requiring additional tests. All of the patient's questions were answered, patient is agreeable to proceed. Consent signed and in chart.   Thank you for this interesting consult.  I greatly enjoyed meeting General Filosa and look forward to participating in their care.  A copy of this report was sent to the requesting provider on this date.  Electronically Signed: Henreitta Cea 12/15/2015, 10:27 AM   I spent a total of  30 Minutes   in face to face in clinical consultation, greater than 50% of which was counseling/coordinating care for prostate cancer with hypermetabolic lymph node

## 2016-01-10 ENCOUNTER — Encounter: Payer: Self-pay | Admitting: Oncology

## 2016-01-10 ENCOUNTER — Telehealth: Payer: Self-pay | Admitting: Oncology

## 2016-01-10 NOTE — Telephone Encounter (Signed)
Pt confirmed appt,, completed intake, request auth from pcp, faxed referring provider, mailed new pt packet

## 2016-01-19 ENCOUNTER — Ambulatory Visit (HOSPITAL_BASED_OUTPATIENT_CLINIC_OR_DEPARTMENT_OTHER): Payer: Medicare HMO | Admitting: Oncology

## 2016-01-19 VITALS — BP 142/80 | HR 74 | Temp 98.7°F | Resp 18 | Ht 70.0 in | Wt 186.0 lb

## 2016-01-19 DIAGNOSIS — Z87891 Personal history of nicotine dependence: Secondary | ICD-10-CM

## 2016-01-19 DIAGNOSIS — C61 Malignant neoplasm of prostate: Secondary | ICD-10-CM | POA: Diagnosis not present

## 2016-01-19 DIAGNOSIS — C775 Secondary and unspecified malignant neoplasm of intrapelvic lymph nodes: Secondary | ICD-10-CM

## 2016-01-19 NOTE — Consult Note (Signed)
Reason for Referral: Prostate cancer.   HPI: 70 year old gentleman currently of [place] where he lived the last 2 years. He lived in multiple locations and originally from [place]. He was diagnosed with prostate cancer dating back to 2010 and his PSA and Gleason score was unclear to me. He was treated with radiation therapy as a definitive modality well living in [place]. He developed a PSA recurrence in 2013 and his PSA was up to 2.9. He had a repeat biopsy at that time which showed a Gleason score 3+4 = 7. He was treated with cryoablation at that time. In 2016 he had a elevated PSA up to 3.2 and a repeat biopsy in March 2016 showed a Gleason score 4+3 = 7 as well as a another foci of Gleason score 4+4 = 8. He underwent a repeat cryotherapy done while he was living in [place]. Most recently he is a PSA went up to 2.96 and a PET scan obtained on 12/01/2015 showed marked hypermetabolic right pelvic sidewall lymph node consistent with metastatic disease. 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 No evidence of metastatic noted anywhere else at that time. He was started on Firmagon under the care of Dr. Gaynelle Arabian after his biopsy confirmed the presence of advanced malignancy. Clinically, he is asymptomatic from his cancer. He denied any decline in his energy her performance status. He remains active and attends activities of daily living. He does report slow urine stream and nocturia. He denied any weight loss or constitutional symptoms. He continues to perform his activities of daily living.  He does not report any headaches, blurry vision, syncope or seizures. He does not report any fevers or chills or sweats. He does not report any cough, wheezing or hemoptysis. Does not report any nausea, vomiting or abdominal pain. He does not report any constipation, diarrhea or change in his bowel habits. He does not report any frequency urgency or  hesitancy. He does not report any skeletal complaints. Remaining review of systems unremarkable.   Past Medical History  Diagnosis Date  . Hypertension   . Hyperlipidemia   . GERD (gastroesophageal reflux disease)   . Nocturia   . Recurrent prostate adenocarcinoma ([address])     first dx'd-- 2010  post external beam radiation--  recurrent march 2015  s/p cryoablation (in [place])  . Wears glasses   :  Past Surgical History  Procedure Laterality Date  . Prostate cryoablation  March 2015  [facility])  . Colonoscopy  last one 2014  . Repair right ring and little fingers  2014     post numbness residual  . Cryoablation N/A 12/05/2014    Procedure: CRYO ABLATION PROSTATE;  Surgeon: Carolan Clines, MD;  Location: [facility];  Service: Urology;  Laterality: N/A;  :   Current outpatient prescriptions:  .  amLODipine (NORVASC) 10 MG tablet, Take 10 mg by mouth daily., Disp: , Rfl:  .  gemfibrozil (LOPID) 600 MG tablet, Take 600 mg by mouth 2 (two) times daily before a meal., Disp: , Rfl: :  No Known Allergies:  No family history on file.:  Social History   Social History  . Marital Status: Single    Spouse Name: N/A  . Number of Children: N/A  . Years of Education: N/A   Occupational History  . Not on file.   Social History Main Topics  . Smoking status: Former Smoker -- 1.00 packs/day for 40 years    Types:  Cigarettes    Quit date: 11/29/2012  . Smokeless tobacco: Never Used  . Alcohol Use: No  . Drug Use: No  . Sexual Activity: Not on file   Other Topics Concern  . Not on file   Social History Narrative  :  Pertinent items are noted in HPI.  Exam: Blood pressure 142/80, pulse 74, temperature 98.7 F (37.1 C), temperature source Oral, resp. rate 18, height 5\' 10"  (1.778 m), weight 186 lb (84.369 kg), SpO2 100 %. General appearance: alert and cooperative appeared without distress. Appears younger than his stated age. Head: Normocephalic, without  obvious abnormality Throat: lips, mucosa, and tongue normal; teeth and gums normal Neck: no adenopathy Back: negative Resp: clear to auscultation bilaterally without dullness to percussion. Chest wall: no tenderness Cardio: regular rate and rhythm, S1, S2 normal, no murmur, click, rub or gallop GI: soft, non-tender; bowel sounds normal; no masses,  no organomegaly Extremities: extremities normal, atraumatic, no cyanosis or edema Pulses: 2+ and symmetric Skin: Skin color, texture, turgor normal. No rashes or lesions   CBC    Component Value Date/Time   WBC 4.1 12/15/2015 0930   RBC 4.59 12/15/2015 0930   HGB 14.2 12/15/2015 0930   HCT 40.4 12/15/2015 0930   PLT 229 12/15/2015 0930   MCV 88.0 12/15/2015 0930   MCH 30.9 12/15/2015 0930   MCHC 35.1 12/15/2015 0930   RDW 13.3 12/15/2015 0930      Assessment and Plan:   70 year old gentleman with the following issues:  1. Prostate cancer diagnosed in 2010. His PSA and Gleason score was unclear at the time of diagnosis, he had developed a recurrence in 2013 with a Gleason score of 4+3 = 7 and subsequently in 2016 with a Gleason score of 4+4 = 8. He was treated with a cryoablation on 2 separate occasions and most recently developed documented advanced disease with hypermetabolic right-sided pelvic sidewall lymph node. This was biopsy-proven on 12/15/2015.  The natural course of this disease was discussed today with the patient and his daughter extensively. He is currently receiving androgen deprivation therapy which is the standard of care at this time. He went to him that androgen deprivation therapy is likely not curative however it will control his disease on average for the next 2-3 years. He will eventually developed progression of disease and will require second line therapy. These therapies include hormonal agents and possibly Taxotere chemotherapy.  I discussed with him today the role of therapy using Zytiga in addition to  androgen deprivation therapy based on recent data. I have estimated improvement in his progression free survival and overall survival with this strategy. We have discussed in detail the complications associated with Zytiga including hypertension, electrolyte imbalance and lower extremity edema. We have also discussed the cost implication associated with taking this medication for an extended period of time.  Overall, I feel the bulk of his disease is rather limited and it is reasonable to continue with androgen deprivation only but certainly there is data to support the use of Zytiga in this particular setting.  He will consider his options and let me know in the near future. Written information and literature was given to the patient and his daughter to aid in their decision making.  2. Follow-up: I'll be happy to see him in the future as needed and certainly will have a routine follow-up if he is to start Zytiga.

## 2016-01-19 NOTE — Progress Notes (Signed)
Please see consult note.  

## 2016-01-19 NOTE — Progress Notes (Signed)
Gave zytiga educational information and a new patient packet with brochures and patient guides and notebook. All questions were answered.

## 2016-01-24 ENCOUNTER — Telehealth: Payer: Self-pay | Admitting: *Deleted

## 2016-01-24 ENCOUNTER — Telehealth: Payer: Self-pay | Admitting: Oncology

## 2016-01-24 ENCOUNTER — Other Ambulatory Visit: Payer: Self-pay | Admitting: *Deleted

## 2016-01-24 MED ORDER — ABIRATERONE ACETATE 250 MG PO TABS: 1000.0000 mg | ORAL_TABLET | Freq: Every day | ORAL | 0 refills | Status: DC

## 2016-01-24 NOTE — Telephone Encounter (Signed)
spoke w/ pt confirmed 8/30 apt

## 2016-01-24 NOTE — Addendum Note (Signed)
Addended by: Wyatt Portela on: 01/24/2016 04:33 PM   Modules accepted: Orders

## 2016-01-24 NOTE — Telephone Encounter (Signed)
Patient calling to say he would like to proceed with Zytiga. Signed script given to Denyse Amass, pharmacist  For patient assistance

## 2016-01-26 ENCOUNTER — Encounter: Payer: Self-pay | Admitting: Pharmacist

## 2016-01-26 NOTE — Progress Notes (Signed)
Oral Chemotherapy Pharmacist Encounter  Received Rx for Zytiga 1000mg  daily. No DDI's noted. Labs ok, however no liver function test results available. Will send Rx to [facility] to see if they can fill.  Johny Drilling, PharmD, BCPS Oral Chemotherapy Clinic

## 2016-01-29 ENCOUNTER — Other Ambulatory Visit: Payer: Self-pay | Admitting: *Deleted

## 2016-01-29 MED ORDER — ABIRATERONE ACETATE 250 MG PO TABS: 1000.0000 mg | ORAL_TABLET | Freq: Every day | ORAL | 0 refills | Status: DC

## 2016-02-02 ENCOUNTER — Encounter: Payer: Self-pay | Admitting: Pharmacist

## 2016-02-02 NOTE — Progress Notes (Signed)
Oral Chemotherapy Pharmacist Encounter   Received notification from [facility] that [name] wil require prior authorization. PA initiated at [email or web address] Key# [id number] PA case# [id number] PA is pending.  Oral Chemo Clinic will follow-up  Johny Drilling, PharmD, BCPS Oral Chemotherapy Clinic

## 2016-02-09 ENCOUNTER — Encounter: Payer: Self-pay | Admitting: Pharmacist

## 2016-02-09 NOTE — Progress Notes (Signed)
Oral Chemotherapy Pharmacist Encounter   Received notification from [facility] that patient's prior authorization for Zytiga had been denied due to using medication off-label. 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  Oral Chemo clinic will continue to follow.  Johny Drilling, PharmD, BCPS Oral Chemotherapy Clinic

## 2016-02-13 ENCOUNTER — Telehealth: Payer: Self-pay | Admitting: Pharmacist

## 2016-02-13 NOTE — Telephone Encounter (Signed)
Oral Chemotherapy Pharmacist Encounter   Received notification from [facility] that prior authorization appeal request for Zytiga had been approved. I called [facility] to have Rx run again and co-pay >$2300  I called patient this morning to talk about patient assistance options. I will start [employer] patient assistance foundation application as there are no monies available for prostate cancer assistance through a foundation.  Patient to obtain proof of social security benefits and retirement pension benefits and either fax or bring into [facility] to complete application.  Pt will also need to sign page 2 of the application in 2 places.  Pt thanked me for my assistance.  Oral Chemo Clinic will continue to follow.  Johny Drilling, PharmD, BCPS Oral Chemotherapy Clinic

## 2016-02-16 NOTE — Progress Notes (Signed)
[employer] Application for Zytiga Assistance  02/15/16- James [name] came by with his financial data, signed forms, and provided SS# for application.  Application faxed to [employer] and received confirmation of receipt.  Will follow-up with [employer] next week on status.  Patient informed to be on the look out for a letter from [employer] in the future informing us of his status.  Also patient expressed that we need to send prednisione script or find out status once we find out Zytiga status.  Patient with an appointment 02/28/16  Thank you  Henreitta Leber, PharmD Oral Oncology [facility]

## 2016-02-20 ENCOUNTER — Telehealth: Payer: Self-pay | Admitting: Pharmacist

## 2016-02-20 MED FILL — ZYTIGA 250 MG TABLET: 250 | 30 days supply | Qty: 120 | Fill #0

## 2016-02-20 NOTE — Telephone Encounter (Signed)
Oral Chemotherapy Pharmacist Encounter   Successfully enrolled patient in [employer] co-pay assistance program for metastatic prostate cancer. Award period: 08/24/15-02/19/17 Award amount [id number] Cardholder [id number] BIN: [id number] PCN: [id number] Group: [id number]  I called patient to alert him of progress and to inform him that his JJPAF application is still pending.   I initiated Zytiga fill at [facility] with information from PAF, co-pay is $0. Pt will pick it up as soon as it is ready.  I counseled patient to take Zytiga on an empty stomach. Side effects include but are not limited to fatigue, LE edema, hypertension, and stomach upset.  Pt expressed understanding and gratitude. We spoke at length about diet and exercise options.  Oral Chemo Clinic will continue to follow.  Johny Drilling, PharmD, BCPS Oral Chemotherapy Clinic

## 2016-02-21 ENCOUNTER — Other Ambulatory Visit: Payer: Self-pay | Admitting: *Deleted

## 2016-02-21 MED ORDER — PREDNISONE 5 MG PO TABS: 5.0000 mg | ORAL_TABLET | Freq: Every day | ORAL | 0 refills | Status: DC

## 2016-02-21 MED FILL — predniSONE 5 MG TABS: 5 | 30 days supply | Qty: 30 | Fill #0

## 2016-02-28 ENCOUNTER — Telehealth: Payer: Self-pay | Admitting: Oncology

## 2016-02-28 ENCOUNTER — Ambulatory Visit (HOSPITAL_BASED_OUTPATIENT_CLINIC_OR_DEPARTMENT_OTHER): Payer: Medicare HMO | Admitting: Oncology

## 2016-02-28 ENCOUNTER — Other Ambulatory Visit (HOSPITAL_BASED_OUTPATIENT_CLINIC_OR_DEPARTMENT_OTHER): Payer: Medicare HMO

## 2016-02-28 VITALS — BP 129/84 | HR 94 | Temp 98.0°F | Resp 18 | Wt 192.7 lb

## 2016-02-28 DIAGNOSIS — E291 Testicular hypofunction: Secondary | ICD-10-CM

## 2016-02-28 DIAGNOSIS — C61 Malignant neoplasm of prostate: Secondary | ICD-10-CM

## 2016-02-28 DIAGNOSIS — C775 Secondary and unspecified malignant neoplasm of intrapelvic lymph nodes: Secondary | ICD-10-CM

## 2016-02-28 DIAGNOSIS — I10 Essential (primary) hypertension: Secondary | ICD-10-CM | POA: Diagnosis not present

## 2016-02-28 LAB — CBC WITH DIFFERENTIAL/PLATELET
BASO%: 1 % (ref 0.0–2.0)
Basophils Absolute: 0.1 10*3/uL (ref 0.0–0.1)
EOS ABS: 0.1 10*3/uL (ref 0.0–0.5)
EOS%: 2.5 % (ref 0.0–7.0)
HEMATOCRIT: 40.2 % (ref 38.4–49.9)
HEMOGLOBIN: 13.4 g/dL (ref 13.0–17.1)
LYMPH#: 1.6 10*3/uL (ref 0.9–3.3)
LYMPH%: 26.9 % (ref 14.0–49.0)
MCH: 29.9 pg (ref 27.2–33.4)
MCHC: 33.4 g/dL (ref 32.0–36.0)
MCV: 89.6 fL (ref 79.3–98.0)
MONO#: 0.8 10*3/uL (ref 0.1–0.9)
MONO%: 13.6 % (ref 0.0–14.0)
NEUT%: 56 % (ref 39.0–75.0)
NEUTROS ABS: 3.3 10*3/uL (ref 1.5–6.5)
Platelets: 249 10*3/uL (ref 140–400)
RBC: 4.48 10*6/uL (ref 4.20–5.82)
RDW: 13.5 % (ref 11.0–14.6)
WBC: 5.9 10*3/uL (ref 4.0–10.3)

## 2016-02-28 LAB — COMPREHENSIVE METABOLIC PANEL
ALBUMIN: 3.5 g/dL (ref 3.5–5.0)
ALK PHOS: 91 U/L (ref 40–150)
ALT: 25 U/L (ref 0–55)
AST: 24 U/L (ref 5–34)
Anion Gap: 8 mEq/L (ref 3–11)
BUN: 22.5 mg/dL (ref 7.0–26.0)
CALCIUM: 10 mg/dL (ref 8.4–10.4)
CO2: 26 mEq/L (ref 22–29)
CREATININE: 1.2 mg/dL (ref 0.7–1.3)
Chloride: 103 mEq/L (ref 98–109)
EGFR: 69 mL/min/{1.73_m2} — ABNORMAL LOW (ref >90)
GLUCOSE: 76 mg/dL (ref 70–140)
POTASSIUM: 4.3 meq/L (ref 3.5–5.1)
Sodium: 137 mEq/L (ref 136–145)
TOTAL PROTEIN: 8.5 g/dL — AB (ref 6.4–8.3)

## 2016-02-28 NOTE — Telephone Encounter (Signed)
GAVE PATIENT AVS REPORT AND APPOINTMENTS FOR October  °

## 2016-02-28 NOTE — Progress Notes (Signed)
Hematology and Oncology Follow Up Visit  Dougal Paolillo [id number] 10-Apr-1946 70 y.o. 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 His initial diagnosis was in 2010. His Gleason score 3+4 = 7.   Prior Therapy: He was diagnosed with prostate cancer dating back to 2010 and his PSA and Gleason score was unclear.  He was treated with radiation therapy as a definitive modality well living in [place].  He developed a PSA recurrence in 2013 and his PSA was up to 2.9. He had a repeat biopsy at that time which showed a Gleason score 3+4 = 7. He was treated with cryoablation at that time.  In 2016 he had a elevated PSA up to 3.2 and a repeat biopsy in March 2016 showed a Gleason score 4+3 = 7 as well as a another foci of Gleason score 4+4 = 8. He underwent a repeat cryotherapy done while he was living in [place].  PSA went up to 2.96 and a PET scan obtained on 12/01/2015 showed marked hypermetabolic right pelvic sidewall lymph node consistent with metastatic disease. He underwent a biopsy on 12/15/2015 and the biopsy confirmed the presence of metastatic prostate cancer   Current therapy: Androgen deprivation therapy intermittently in addition to Zytiga started in July 2017.  Interim History:  Mr. Magalhaes presents today for a follow-up visit. Since the last visit, he started Zytiga with prednisone and have tolerated it well. He denied any nausea, abdominal pain or lower extremity edema. He has been monitoring his blood pressure and has been within normal range. He continues to be active and perform activities of daily living. His appetite remains excellent and have gained some weight since the last visit.  He does not report any headaches, blurry vision, syncope or seizures. He does not report any fevers or chills or sweats. He does not report any  cough, wheezing or hemoptysis. Does not report any nausea, vomiting or abdominal pain. He does not report any constipation, diarrhea or change in his bowel habits. He does not report any frequency urgency or hesitancy. He does not report any skeletal complaints. Remaining review of systems unremarkable.  Medications: I have reviewed the patient's current medications.  Current Outpatient Prescriptions  Medication Sig Dispense Refill  . abiraterone Acetate (ZYTIGA) 250 MG tablet Take 4 tablets (1,000 mg total) by mouth daily. Take on an empty stomach 1 hour before or 2 hours after a meal 120 tablet 0  . amLODipine (NORVASC) 10 MG tablet Take 10 mg by mouth daily.    Marland Kitchen gemfibrozil (LOPID) 600 MG tablet Take 600 mg by mouth 2 (two) times daily before a meal.    . predniSONE (DELTASONE) 5 MG tablet Take 1 tablet (5 mg total) by mouth daily with breakfast. 30 tablet 0   No current facility-administered medications for this visit.      Allergies: No Known Allergies  Past Medical History, Surgical history, Social history, and Family History were reviewed and updated.   Physical Exam: Blood pressure 129/84, pulse 94, temperature 98 F (36.7 C), temperature source Oral, resp. rate 18, weight 192 lb 11.2 oz (87.4 kg), SpO2 99 %. ECOG: 0 General appearance: alert and cooperative Head: Normocephalic, without obvious abnormality Neck: no adenopathy Lymph nodes: Cervical, supraclavicular, and axillary nodes normal. Heart:regular rate and rhythm, S1, S2 normal, no murmur, click, rub or gallop Lung:chest clear, no wheezing, rales, normal symmetric air  entry, Heart exam - S1, S2 normal, no murmur, no gallop, rate regular Abdomin: soft, non-tender, without masses or organomegaly EXT:no erythema, induration, or nodules   Lab Results: Lab Results  Component Value Date   WBC 5.9 02/28/2016   HGB 13.4 02/28/2016   HCT 40.2 02/28/2016   MCV 89.6 02/28/2016   PLT 249 02/28/2016     Chemistry       Component Value Date/Time   NA 140 12/05/2014 0848   K 4.1 12/05/2014 0848   CREATININE 1.30 (H) 11/28/2015 1054   No results found for: CALCIUM, ALKPHOS, AST, ALT, BILITOT       Impression and Plan:  69 year old gentleman with the following issues:  1. Prostate cancer diagnosed in 2010. His PSA and Gleason score was unclear at the time of diagnosis, he had developed a recurrence in 2013 with a Gleason score of 4+3 = 7 and subsequently in 2016 with a Gleason score of 4+4 = 8. He was treated with a cryoablation on 2 separate occasions and most recently developed documented advanced disease with hypermetabolic right-sided pelvic sidewall lymph node. This was biopsy-proven on 12/15/2015.   He is currently on androgen deprivation therapy with Zytiga because of his high risk disease. He has tolerated this therapy very well without any major complications. His PSA is currently pending and the plan is to continue with the same dose and schedule.   2. Hypertension: His blood pressure will continue to be monitored on Zytiga and underwent a hypertensive medication could be started in the future.  3. Hypokalemia: His potassium will be checked today and periodically and replaced.  4. Androgen depravation therapy: I recommended to continue this indefinitely.  5. Follow-up: Will be in 6 weeks to repeat laboratory testing and physical examination.  Mackie Goon, MD 8/30/201710:19 AM

## 2016-02-29 LAB — PSA: Prostate Specific Ag, Serum: 0.3 ng/mL (ref 0.0–4.0)

## 2016-03-08 ENCOUNTER — Telehealth: Payer: Self-pay | Admitting: Pharmacist

## 2016-03-08 NOTE — Telephone Encounter (Signed)
Received fax notification that patient doesn't meet the eligibility criteria for enrollment into [employer] at this time. Patient also notified of decision.

## 2016-03-20 ENCOUNTER — Other Ambulatory Visit: Payer: Self-pay | Admitting: Oncology

## 2016-03-20 MED FILL — ZYTIGA 250 MG TABLET: 250 | 30 days supply | Qty: 120 | Fill #0

## 2016-03-20 MED FILL — predniSONE 5 MG TABS: 5 | 30 days supply | Qty: 30 | Fill #0

## 2016-03-20 NOTE — Telephone Encounter (Signed)
Pt called for refill on his zytiga & prednisone.  Message to Dr Corliss Parish RN

## 2016-03-21 ENCOUNTER — Encounter: Payer: Self-pay | Admitting: Oncology

## 2016-03-21 NOTE — Progress Notes (Signed)
Rcvd letter from [facility] stating pt doesn't meet the eligibility requirements at this time so they aren't able to provide pt with Zytiga.  Pt was notified of this decision.

## 2016-04-03 ENCOUNTER — Encounter: Payer: Self-pay | Admitting: *Deleted

## 2016-04-03 ENCOUNTER — Encounter: Payer: Self-pay | Admitting: Oncology

## 2016-04-03 ENCOUNTER — Ambulatory Visit (HOSPITAL_BASED_OUTPATIENT_CLINIC_OR_DEPARTMENT_OTHER): Payer: Medicare HMO | Admitting: Oncology

## 2016-04-03 ENCOUNTER — Other Ambulatory Visit (HOSPITAL_BASED_OUTPATIENT_CLINIC_OR_DEPARTMENT_OTHER): Payer: Medicare HMO

## 2016-04-03 ENCOUNTER — Telehealth: Payer: Self-pay | Admitting: Oncology

## 2016-04-03 VITALS — BP 134/84 | HR 97 | Temp 98.0°F | Resp 18 | Wt 192.4 lb

## 2016-04-03 DIAGNOSIS — C775 Secondary and unspecified malignant neoplasm of intrapelvic lymph nodes: Secondary | ICD-10-CM

## 2016-04-03 DIAGNOSIS — C61 Malignant neoplasm of prostate: Secondary | ICD-10-CM | POA: Diagnosis not present

## 2016-04-03 DIAGNOSIS — I10 Essential (primary) hypertension: Secondary | ICD-10-CM

## 2016-04-03 DIAGNOSIS — E876 Hypokalemia: Secondary | ICD-10-CM | POA: Diagnosis not present

## 2016-04-03 DIAGNOSIS — E291 Testicular hypofunction: Secondary | ICD-10-CM | POA: Diagnosis not present

## 2016-04-03 LAB — CBC WITH DIFFERENTIAL/PLATELET
BASO%: 0.8 % (ref 0.0–2.0)
BASOS ABS: 0.1 10*3/uL (ref 0.0–0.1)
EOS ABS: 0.2 10*3/uL (ref 0.0–0.5)
EOS%: 2.6 % (ref 0.0–7.0)
HEMATOCRIT: 41.5 % (ref 38.4–49.9)
HEMOGLOBIN: 13.9 g/dL (ref 13.0–17.1)
LYMPH#: 1.1 10*3/uL (ref 0.9–3.3)
LYMPH%: 14.8 % (ref 14.0–49.0)
MCH: 29.9 pg (ref 27.2–33.4)
MCHC: 33.6 g/dL (ref 32.0–36.0)
MCV: 89 fL (ref 79.3–98.0)
MONO#: 0.6 10*3/uL (ref 0.1–0.9)
MONO%: 8.7 % (ref 0.0–14.0)
NEUT%: 73.1 % (ref 39.0–75.0)
NEUTROS ABS: 5.4 10*3/uL (ref 1.5–6.5)
Platelets: 248 10*3/uL (ref 140–400)
RBC: 4.67 10*6/uL (ref 4.20–5.82)
RDW: 13.6 % (ref 11.0–14.6)
WBC: 7.4 10*3/uL (ref 4.0–10.3)

## 2016-04-03 LAB — COMPREHENSIVE METABOLIC PANEL
ALBUMIN: 3.4 g/dL — AB (ref 3.5–5.0)
ALK PHOS: 83 U/L (ref 40–150)
ALT: 25 U/L (ref 0–55)
AST: 25 U/L (ref 5–34)
Anion Gap: 10 mEq/L (ref 3–11)
BILIRUBIN TOTAL: 0.52 mg/dL (ref 0.20–1.20)
BUN: 33.6 mg/dL — AB (ref 7.0–26.0)
CALCIUM: 10.6 mg/dL — AB (ref 8.4–10.4)
CO2: 24 mEq/L (ref 22–29)
Chloride: 104 mEq/L (ref 98–109)
Creatinine: 1.5 mg/dL — ABNORMAL HIGH (ref 0.7–1.3)
EGFR: 55 mL/min/{1.73_m2} — ABNORMAL LOW (ref >90)
GLUCOSE: 130 mg/dL (ref 70–140)
POTASSIUM: 3.7 meq/L (ref 3.5–5.1)
SODIUM: 139 meq/L (ref 136–145)
TOTAL PROTEIN: 8.6 g/dL — AB (ref 6.4–8.3)

## 2016-04-03 NOTE — Progress Notes (Signed)
Hematology and Oncology Follow Up Visit  James [name] [id number] January 02, 1946 70 y.o. 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 His initial diagnosis was in 2010. His Gleason score 3+4 = 7.   Prior Therapy: He was diagnosed with prostate cancer dating back to 2010 and his PSA and Gleason score was unclear.  He was treated with radiation therapy as a definitive modality well living in [place].  He developed a PSA recurrence in 2013 and his PSA was up to 2.9. He had a repeat biopsy at that time which showed a Gleason score 3+4 = 7. He was treated with cryoablation at that time.  In 2016 he had a elevated PSA up to 3.2 and a repeat biopsy in March 2016 showed a Gleason score 4+3 = 7 as well as a another foci of Gleason score 4+4 = 8. He underwent a repeat cryotherapy done while he was living in [place].  PSA went up to 2.96 and a PET scan obtained on 12/01/2015 showed marked hypermetabolic right pelvic sidewall lymph node consistent with metastatic disease. He underwent a biopsy on 12/15/2015 and the biopsy confirmed the presence of metastatic prostate cancer   Current therapy:  Androgen deprivation therapy intermittently given by Dr. Gaynelle Arabian. Zytiga started in July 2017.  Interim History:  James [name] presents today for a follow-up visit. Since the last visit, he reports no major complications. He continues to take  Zytiga with prednisone and have tolerated it well. He denied any nausea, abdominal pain or lower extremity edema. He has been monitoring his blood pressure and has been within normal range. He continues to be active and perform activities of daily living. He reports no hot flashes or bone pain since last visit. He travels frequently and remains in excellent health and shape.  He does not report any headaches, blurry  vision, syncope or seizures. He does not report any fevers or chills or sweats. He does not report any cough, wheezing or hemoptysis. Does not report any nausea, vomiting or abdominal pain. He does not report any constipation, diarrhea or change in his bowel habits. He does not report any frequency urgency or hesitancy. He does not report any skeletal complaints. Remaining review of systems unremarkable.  Medications: I have reviewed the patient's current medications.  Current Outpatient Prescriptions  Medication Sig Dispense Refill  . amLODipine (NORVASC) 10 MG tablet Take 10 mg by mouth daily.    Marland Kitchen gemfibrozil (LOPID) 600 MG tablet Take 600 mg by mouth 2 (two) times daily before a meal.    . predniSONE (DELTASONE) 5 MG tablet TAKE 1 TABLET BY MOUTH DAILY WITH BREAKFAST. 30 tablet 0  . ZYTIGA 250 MG tablet TAKE 4 TABLETS BY MOUTH DAILY. TAKE ON AN EMPTY STOMACH 1 HOUR BEFORE OR 2 HOURS AFTER A MEAL 120 tablet 0   No current facility-administered medications for this visit.      Allergies: No Known Allergies  Past Medical History, Surgical history, Social history, and Family History were reviewed and updated.   Physical Exam: Blood pressure 134/84, pulse 97, temperature 98 F (36.7 C), temperature source Oral, resp. rate 18, weight 192 lb 6.4 oz (87.3 kg), SpO2 100 %. ECOG: 0 General appearance: alert and cooperative appeared without distress. Head: Normocephalic, without obvious abnormality oral ulcers or lesion. Neck: no adenopathy Lymph nodes: Cervical, supraclavicular, and axillary nodes normal. Heart:regular rate and rhythm,  S1, S2 normal, no murmur, click, rub or gallop Lung:chest clear, no wheezing, rales, normal symmetric air entry.  Abdomin: soft, non-tender, without masses or organomegaly EXT:no erythema, induration, or nodules   Lab Results: Lab Results  Component Value Date   WBC 7.4 04/03/2016   HGB 13.9 04/03/2016   HCT 41.5 04/03/2016   MCV 89.0 04/03/2016   PLT  248 04/03/2016     Chemistry      Component Value Date/Time   NA 137 02/28/2016 0936   K 4.3 02/28/2016 0936   CO2 26 02/28/2016 0936   BUN 22.5 02/28/2016 0936   CREATININE 1.2 02/28/2016 0936      Component Value Date/Time   CALCIUM 10.0 02/28/2016 0936   ALKPHOS 91 02/28/2016 0936   AST 24 02/28/2016 0936   ALT 25 02/28/2016 0936   BILITOT <0.30 02/28/2016 0936       Results for James [name] (MRN [id number]) as of 04/03/2016 09:38  Ref. Range 02/28/2016 09:36  PSA Latest Ref Range: 0.0 - 4.0 ng/mL 0.3    Impression and Plan:  70 year old gentleman with the following issues:  1. Prostate cancer diagnosed in 2010. His PSA and Gleason score was unclear at the time of diagnosis, he had developed a recurrence in 2013 with a Gleason score of 4+3 = 7 and subsequently in 2016 with a Gleason score of 4+4 = 8. He was treated with a cryoablation on 2 separate occasions and most recently developed documented advanced disease with hypermetabolic right-sided pelvic sidewall lymph node. This was biopsy-proven on 12/15/2015.   He is currently on androgen deprivation therapy with Zytiga because of his high risk disease. He has tolerated this therapy very well without any major complications. His PSA decreased appropriately down to 0.3. The plan is to continue with the same dose and schedule continue to monitor him periodically.  2. Hypertension: His blood pressure will continue to be monitored on Zytiga. His blood pressure under excellent control.  3. Hypokalemia: His potassium will be checked today and periodically and replaced.  4. Androgen depravation therapy: I recommended to continue this indefinitely. He is receiving it with Dr. Gaynelle Arabian.  5. Follow-up: Will be in 6 weeks to repeat laboratory testing and physical examination.  [id number], MD 10/4/20179:52 AM

## 2016-04-03 NOTE — Progress Notes (Signed)
Faxed paperwork to [place] (CPR) for financial support from [employer] [place]. Fax # is [phone]. This is for Zytiga.

## 2016-04-03 NOTE — Telephone Encounter (Signed)
Gave patient avs report and appointments for November.  °

## 2016-04-04 ENCOUNTER — Telehealth: Payer: Self-pay | Admitting: *Deleted

## 2016-04-04 LAB — PSA

## 2016-04-04 NOTE — Telephone Encounter (Signed)
-----   Message from Wyatt Portela, MD sent at 04/04/2016  8:55 AM EDT ----- Please let him know his PSA. Very low.

## 2016-04-04 NOTE — Telephone Encounter (Signed)
As noted below by Dr. Shadad, I informed patient of his PSA level. Patient verbalized understanding. 

## 2016-04-18 ENCOUNTER — Other Ambulatory Visit: Payer: Self-pay | Admitting: Oncology

## 2016-04-18 MED FILL — predniSONE 5 MG TABS: 5 | 30 days supply | Qty: 30 | Fill #0

## 2016-04-19 ENCOUNTER — Telehealth: Payer: Self-pay | Admitting: Pharmacist

## 2016-04-19 NOTE — Telephone Encounter (Signed)
Oral Chemotherapy Pharmacist Encounter  Received call from [place] this morning in reference to patient's Zytiga. Patient is enrolled for copay assistance from patient advocate foundation but there appears to be a problem on their end helping with the Zytiga copay. Patient called PAF and was told application was with supervisor for review and final determination is not yet made. In order to avoid treatment delay, we will dispense Zytiga samples for James [name] for this next cycle. Patient to arrive at [facility] in 30-40min to pick up bottle.  Zytiga 250mg  tablets Take 4 tablets (1000mg ) by mouth once daily, take on an empty stomach 1 hour before or 2 hours after a meal. Quantity dispensed: 120 tablets Days supply: 30 Manufacturer: [employer] Exp: 01/2017  Johny Drilling, PharmD, BCPS 04/19/2016  1:52 PM Oral Chemotherapy Clinic [phone]

## 2016-05-15 ENCOUNTER — Ambulatory Visit (HOSPITAL_BASED_OUTPATIENT_CLINIC_OR_DEPARTMENT_OTHER): Payer: Medicare HMO | Admitting: Oncology

## 2016-05-15 ENCOUNTER — Telehealth: Payer: Self-pay | Admitting: Oncology

## 2016-05-15 ENCOUNTER — Other Ambulatory Visit (HOSPITAL_BASED_OUTPATIENT_CLINIC_OR_DEPARTMENT_OTHER): Payer: Medicare HMO

## 2016-05-15 VITALS — BP 129/76 | HR 84 | Temp 98.0°F | Resp 18 | Wt 196.1 lb

## 2016-05-15 DIAGNOSIS — C775 Secondary and unspecified malignant neoplasm of intrapelvic lymph nodes: Secondary | ICD-10-CM

## 2016-05-15 DIAGNOSIS — E876 Hypokalemia: Secondary | ICD-10-CM | POA: Diagnosis not present

## 2016-05-15 DIAGNOSIS — C61 Malignant neoplasm of prostate: Secondary | ICD-10-CM

## 2016-05-15 DIAGNOSIS — E291 Testicular hypofunction: Secondary | ICD-10-CM | POA: Diagnosis not present

## 2016-05-15 DIAGNOSIS — I10 Essential (primary) hypertension: Secondary | ICD-10-CM

## 2016-05-15 LAB — COMPREHENSIVE METABOLIC PANEL
ALT: 20 U/L (ref 0–55)
ANION GAP: 7 meq/L (ref 3–11)
AST: 22 U/L (ref 5–34)
Albumin: 3.5 g/dL (ref 3.5–5.0)
Alkaline Phosphatase: 95 U/L (ref 40–150)
BUN: 22.5 mg/dL (ref 7.0–26.0)
CALCIUM: 10.3 mg/dL (ref 8.4–10.4)
CHLORIDE: 104 meq/L (ref 98–109)
CO2: 26 mEq/L (ref 22–29)
Creatinine: 1.3 mg/dL (ref 0.7–1.3)
EGFR: 63 mL/min/{1.73_m2} — ABNORMAL LOW (ref >90)
Glucose: 83 mg/dl (ref 70–140)
POTASSIUM: 4.5 meq/L (ref 3.5–5.1)
Sodium: 138 mEq/L (ref 136–145)
Total Bilirubin: 0.33 mg/dL (ref 0.20–1.20)
Total Protein: 8.3 g/dL (ref 6.4–8.3)

## 2016-05-15 LAB — CBC WITH DIFFERENTIAL/PLATELET
BASO%: 0.3 % (ref 0.0–2.0)
BASOS ABS: 0 10*3/uL (ref 0.0–0.1)
EOS%: 1.5 % (ref 0.0–7.0)
Eosinophils Absolute: 0.1 10*3/uL (ref 0.0–0.5)
HEMATOCRIT: 38.9 % (ref 38.4–49.9)
HGB: 13.2 g/dL (ref 13.0–17.1)
LYMPH#: 1.2 10*3/uL (ref 0.9–3.3)
LYMPH%: 14.8 % (ref 14.0–49.0)
MCH: 30.6 pg (ref 27.2–33.4)
MCHC: 33.9 g/dL (ref 32.0–36.0)
MCV: 90.3 fL (ref 79.3–98.0)
MONO#: 0.8 10*3/uL (ref 0.1–0.9)
MONO%: 9.9 % (ref 0.0–14.0)
NEUT#: 5.7 10*3/uL (ref 1.5–6.5)
NEUT%: 73.5 % (ref 39.0–75.0)
PLATELETS: 200 10*3/uL (ref 140–400)
RBC: 4.31 10*6/uL (ref 4.20–5.82)
RDW: 13.3 % (ref 11.0–14.6)
WBC: 7.8 10*3/uL (ref 4.0–10.3)

## 2016-05-15 NOTE — Telephone Encounter (Signed)
Follow up appointments and labs, date scheduled per patient, per 05/15/16 los. A copy of the  AVS report and appointment schedule, was given to patient per 05/15/16 los.

## 2016-05-15 NOTE — Progress Notes (Signed)
Hematology and Oncology Follow Up Visit  [name] [id number] [birth year] 70 y.o. 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 His initial diagnosis was in 2010. His Gleason score 3+4 = 7.   Prior Therapy: He was diagnosed with prostate cancer dating back to 2010 and his PSA and Gleason score was unclear.  He was treated with radiation therapy as a definitive modality well living in [place].  He developed a PSA recurrence in 2013 and his PSA was up to 2.9. He had a repeat biopsy at that time which showed a Gleason score 3+4 = 7. He was treated with cryoablation at that time.  In 2016 he had a elevated PSA up to 3.2 and a repeat biopsy in March 2016 showed a Gleason score 4+3 = 7 as well as a another foci of Gleason score 4+4 = 8. He underwent a repeat cryotherapy done while he was living in [place].  PSA went up to 2.96 and a PET scan obtained on 12/01/2015 showed marked hypermetabolic right pelvic sidewall lymph node consistent with metastatic disease. He underwent a biopsy on 12/15/2015 and the biopsy confirmed the presence of metastatic prostate cancer   Current therapy:  Androgen deprivation therapy intermittently given by Dr. Gaynelle Arabian. Zytiga started in July 2017.  Interim History:  Mr. James [name] presents today for a follow-up visit. Since the last visit, he continues to do well without any changes in his health. He continues to take  Zytiga with prednisone without any complaints and denied any nausea, abdominal pain or lower extremity edema. He has been monitoring his blood pressure and has been within normal range. He continues to be active and perform activities of daily living. He reports no hot flashes or bone pain since last visit. His appetite remain excellent and have gained some weight since last visit.  He does not  report any headaches, blurry vision, syncope or seizures. He does not report any fevers or chills or sweats. He does not report any cough, wheezing or hemoptysis. Does not report any nausea, vomiting or abdominal pain. He does not report any constipation, diarrhea or change in his bowel habits. He does not report any frequency urgency or hesitancy. He does not report any skeletal complaints. Remaining review of systems unremarkable.  Medications: I have reviewed the patient's current medications.  Current Outpatient Prescriptions  Medication Sig Dispense Refill  . amLODipine (NORVASC) 10 MG tablet Take 10 mg by mouth daily.    Marland Kitchen gemfibrozil (LOPID) 600 MG tablet Take 600 mg by mouth 2 (two) times daily before a meal.    . predniSONE (DELTASONE) 5 MG tablet TAKE 1 TABLET BY MOUTH DAILY WITH BREAKFAST. 30 tablet 0  . ZYTIGA 250 MG tablet TAKE 4 TABLETS BY MOUTH DAILY. TAKE ON AN EMPTY STOMACH 1 HOUR BEFORE OR 2 HOURS AFTER A MEAL 120 tablet 0   No current facility-administered medications for this visit.      Allergies: No Known Allergies  Past Medical History, Surgical history, Social history, and Family History were reviewed and updated.   Physical Exam: Blood pressure 129/76, pulse 84, temperature 98 F (36.7 C), temperature source Oral, resp. rate 18, weight 196 lb 1.6 oz (89 kg), SpO2 100 %. ECOG: 0 General appearance: Well-appearing gentleman appeared comfortable without distress. Head: Normocephalic, without obvious abnormality no oral ulcers or lesions. Neck: no adenopathy Lymph nodes: Cervical, supraclavicular, and  axillary nodes normal. Heart:regular rate and rhythm, S1, S2 normal, no murmur, click, rub or gallop Lung:chest clear, no wheezing, rales, normal symmetric air entry.  Abdomin: soft, non-tender, without masses or organomegaly no shifting dullness or ascites. EXT:no erythema, induration, or nodules   Lab Results: Lab Results  Component Value Date   WBC 7.8  05/15/2016   HGB 13.2 05/15/2016   HCT 38.9 05/15/2016   MCV 90.3 05/15/2016   PLT 200 05/15/2016     Chemistry      Component Value Date/Time   NA 138 05/15/2016 1053   K 4.5 05/15/2016 1053   CO2 26 05/15/2016 1053   BUN 22.5 05/15/2016 1053   CREATININE 1.3 05/15/2016 1053      Component Value Date/Time   CALCIUM 10.3 05/15/2016 1053   ALKPHOS 95 05/15/2016 1053   AST 22 05/15/2016 1053   ALT 20 05/15/2016 1053   BILITOT 0.33 05/15/2016 1053        Results for James, [name] (MRN [id number]) as of 05/15/2016 12:08  Ref. Range 02/28/2016 09:36 04/03/2016 09:05  PSA Latest Ref Range: 0.0 - 4.0 ng/mL 0.3 <0.1    Impression and Plan:  70 year old gentleman with the following issues:  1. Prostate cancer diagnosed in 2010. His PSA and Gleason score was unclear at the time of diagnosis, he had developed a recurrence in 2013 with a Gleason score of 4+3 = 7 and subsequently in 2016 with a Gleason score of 4+4 = 8. He was treated with a cryoablation on 2 separate occasions and most recently developed documented advanced disease with hypermetabolic right-sided pelvic sidewall lymph node. This was biopsy-proven on 12/15/2015.   He is currently on androgen deprivation therapy with Zytiga because of his high risk disease. His PSA shows excellent response close to 0. He denied any complications related to this medication and the plan is to continue with the same dose and schedule.  2. Hypertension: His blood pressure will continue to be monitored on Zytiga. His blood pressure under excellent control.  3. Hypokalemia: His potassium will be checked today and periodically and replaced. Potassium has been within normal range.  4. Androgen depravation therapy: I recommended to continue this indefinitely. He is receiving it with Dr. Gaynelle Arabian.  5. Follow-up: Will be in 6 weeks with a clinical visit. This can be done sooner if he experiences any difficulties.  [facility],  MD 11/15/201712:04 PM

## 2016-05-16 ENCOUNTER — Telehealth: Payer: Self-pay | Admitting: *Deleted

## 2016-05-16 LAB — PSA

## 2016-05-16 NOTE — Telephone Encounter (Signed)
As noted below by Dr. Shadad, I informed patient of his PSA level. Patient verbalized understanding. 

## 2016-05-16 NOTE — Telephone Encounter (Signed)
-----   Message from Wyatt Portela, MD sent at 05/16/2016  8:35 AM EST ----- Please let him know his PSA still very low.

## 2016-05-17 ENCOUNTER — Other Ambulatory Visit: Payer: Self-pay | Admitting: Oncology

## 2016-05-17 ENCOUNTER — Telehealth: Payer: Self-pay | Admitting: Pharmacist

## 2016-05-17 ENCOUNTER — Other Ambulatory Visit: Payer: Self-pay | Admitting: *Deleted

## 2016-05-17 MED ORDER — ABIRATERONE ACETATE 250 MG PO TABS: ORAL_TABLET | ORAL | 0 refills | Status: DC

## 2016-05-17 MED FILL — predniSONE 5 MG TABS: 5 | 30 days supply | Qty: 30 | Fill #0

## 2016-05-17 NOTE — Telephone Encounter (Signed)
Oral Chemotherapy Pharmacist Encounter  Received several messages from patient and the [facility] that the patient's copay assistance from [employer] has still not been reopened.  I called [employer] since we had tried to get assistance for this patient back in August and September of this year to see why he was not eligible for enrollment into their assistance foundation. Since patient is Medicare, he must prove that he has spent 4% of his total income on medications in the past year. This number calculated to $851.16 out of pocket spent for 2017. I LVM for patient with all of this information. Patient has noted that he is out of medication so discussed issue with MD. MD ok if patient has to take a few weeks break from treatment as his PSA is controlled and there are no there options at this time.  Oral Chemo Clinic will continue to follow for PAF funds to be reopened.  Johny Drilling, PharmD, BCPS 05/17/2016  3:38 PM Oral Chemotherapy Clinic [phone]

## 2016-05-20 NOTE — Telephone Encounter (Signed)
Oral Chemotherapy Pharmacist Encounter  Received print out from [facility] which shows $3445.45 OOP on medications for 2017. This will be faxed to [employer] to include in application appeal.  Oral Chemo Clinic will continue to follow.  Johny Drilling, PharmD, BCPS 05/20/2016  2:11 PM Oral Chemotherapy Clinic [phone]

## 2016-05-20 NOTE — Telephone Encounter (Signed)
Oral Chemotherapy Pharmacist Encounter  Met patient in lobby this morning who had brought in some insurance documents which shows some of his out of pocket expenses for year to date 2017.  Upon further inspection of documents, they show patient OOP spend for medical expenses.  In order to qualify for Zytiga assistance through [employer], we must show OOP expenses for prescriptions. I called [employer] for this information and they will be faxing me documentation for patient's drug spend for 2017. We should receive this information by 11/21 at the latest. If this print-out shows that patient has spent >/= $851.16, I will fax this info to [employer]. I LVM for patient with above information.  Oral Chemo Clinic will continue to follow.  Johny Drilling, PharmD, BCPS 05/20/2016  10:26 AM Oral Chemotherapy Clinic [phone]

## 2016-06-05 ENCOUNTER — Other Ambulatory Visit: Payer: Self-pay | Admitting: Oncology

## 2016-06-05 MED FILL — ZYTIGA 250 MG TABLET: 250 | 30 days supply | Qty: 120 | Fill #0

## 2016-06-14 NOTE — Telephone Encounter (Signed)
Received notification from [employer] that the print out from [facility] showing out of pocket pharmacy expenses was unable to be read and we needed to provide a clear copy. I called and spoke with Kelby Aline at [facility]. She will refax a copy of the Alternate Member Profile Report showing out of pocket pharmacy expenses to [phone].   Case [id number]

## 2016-06-17 MED FILL — predniSONE 5 MG TABS: 5 | 30 days supply | Qty: 30 | Fill #0

## 2016-06-21 NOTE — Telephone Encounter (Signed)
Oral Chemotherapy Pharmacist Encounter  New out of pocket expense report from [facility] received. Faxed to [employer] at [phone].  This encounter will continue to be updated until final determination.  Oral Oncology Clinic will continue to follow.   Johny Drilling, PharmD, BCPS, BCOP 06/21/2016  4:38 PM Oral Oncology Clinic [phone]

## 2016-07-02 ENCOUNTER — Other Ambulatory Visit: Payer: Self-pay | Admitting: Oncology

## 2016-07-03 ENCOUNTER — Encounter: Payer: Self-pay | Admitting: Pharmacist

## 2016-07-03 ENCOUNTER — Ambulatory Visit (HOSPITAL_BASED_OUTPATIENT_CLINIC_OR_DEPARTMENT_OTHER): Payer: Medicare HMO | Admitting: Oncology

## 2016-07-03 ENCOUNTER — Telehealth: Payer: Self-pay | Admitting: Oncology

## 2016-07-03 ENCOUNTER — Other Ambulatory Visit (HOSPITAL_BASED_OUTPATIENT_CLINIC_OR_DEPARTMENT_OTHER): Payer: Medicare HMO

## 2016-07-03 ENCOUNTER — Telehealth: Payer: Self-pay | Admitting: Pharmacist

## 2016-07-03 VITALS — BP 117/69 | HR 81 | Temp 97.9°F | Resp 16 | Ht 70.0 in | Wt 197.5 lb

## 2016-07-03 DIAGNOSIS — C61 Malignant neoplasm of prostate: Secondary | ICD-10-CM

## 2016-07-03 DIAGNOSIS — E291 Testicular hypofunction: Secondary | ICD-10-CM

## 2016-07-03 DIAGNOSIS — C775 Secondary and unspecified malignant neoplasm of intrapelvic lymph nodes: Secondary | ICD-10-CM

## 2016-07-03 DIAGNOSIS — I10 Essential (primary) hypertension: Secondary | ICD-10-CM

## 2016-07-03 LAB — CBC WITH DIFFERENTIAL/PLATELET
BASO%: 0.3 % (ref 0.0–2.0)
BASOS ABS: 0 10*3/uL (ref 0.0–0.1)
EOS ABS: 0.2 10*3/uL (ref 0.0–0.5)
EOS%: 2.5 % (ref 0.0–7.0)
HEMATOCRIT: 38.6 % (ref 38.4–49.9)
HEMOGLOBIN: 12.9 g/dL — AB (ref 13.0–17.1)
LYMPH#: 1.1 10*3/uL (ref 0.9–3.3)
LYMPH%: 18.1 % (ref 14.0–49.0)
MCH: 30.1 pg (ref 27.2–33.4)
MCHC: 33.4 g/dL (ref 32.0–36.0)
MCV: 90.2 fL (ref 79.3–98.0)
MONO#: 0.8 10*3/uL (ref 0.1–0.9)
MONO%: 13.8 % (ref 0.0–14.0)
NEUT%: 65.3 % (ref 39.0–75.0)
NEUTROS ABS: 3.9 10*3/uL (ref 1.5–6.5)
PLATELETS: 190 10*3/uL (ref 140–400)
RBC: 4.28 10*6/uL (ref 4.20–5.82)
RDW: 13.4 % (ref 11.0–14.6)
WBC: 6 10*3/uL (ref 4.0–10.3)

## 2016-07-03 LAB — COMPREHENSIVE METABOLIC PANEL
ALT: 24 U/L (ref 0–55)
AST: 24 U/L (ref 5–34)
Albumin: 3.7 g/dL (ref 3.5–5.0)
Alkaline Phosphatase: 96 U/L (ref 40–150)
Anion Gap: 8 mEq/L (ref 3–11)
BILIRUBIN TOTAL: 0.23 mg/dL (ref 0.20–1.20)
BUN: 24.2 mg/dL (ref 7.0–26.0)
CALCIUM: 10.1 mg/dL (ref 8.4–10.4)
CO2: 26 mEq/L (ref 22–29)
Chloride: 106 mEq/L (ref 98–109)
Creatinine: 1.2 mg/dL (ref 0.7–1.3)
EGFR: 73 mL/min/{1.73_m2} — AB (ref >90)
GLUCOSE: 81 mg/dL (ref 70–140)
Potassium: 4.5 mEq/L (ref 3.5–5.1)
SODIUM: 140 meq/L (ref 136–145)
TOTAL PROTEIN: 8 g/dL (ref 6.4–8.3)

## 2016-07-03 NOTE — Progress Notes (Signed)
Provided 1 bottle of Zytiga 250 mg tablets (30 day supply) for pt.  Lot# [place]; Exp 03/2018. Given to Randolm Idol, RN to give to pt while he is here today.  Kennith Center, Pharm.D., CPP 07/03/2016@11 :32 AM Oral Chemo Clinic

## 2016-07-03 NOTE — Progress Notes (Signed)
Hematology and Oncology Follow Up Visit  James [name] [id number] 1945-12-03 71 y.o. 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 His initial diagnosis was in 2010. His Gleason score 3+4 = 7.   Prior Therapy: He was diagnosed with prostate cancer dating back to 2010 and his PSA and Gleason score was unclear.  He was treated with radiation therapy as a definitive modality well living in [place].  He developed a PSA recurrence in 2013 and his PSA was up to 2.9. He had a repeat biopsy at that time which showed a Gleason score 3+4 = 7. He was treated with cryoablation at that time.  In 2016 he had a elevated PSA up to 3.2 and a repeat biopsy in March 2016 showed a Gleason score 4+3 = 7 as well as a another foci of Gleason score 4+4 = 8. He underwent a repeat cryotherapy done while he was living in [place].  PSA went up to 2.96 and a PET scan obtained on 12/01/2015 showed marked hypermetabolic right pelvic sidewall lymph node consistent with metastatic disease. He underwent a biopsy on 12/15/2015 and the biopsy confirmed the presence of metastatic prostate cancer   Current therapy:  Androgen deprivation therapy intermittently given by Dr. Gaynelle Arabian. Zytiga started in July 2017.  Interim History:  Mr. Rogus presents today for a follow-up visit. Since the last visit, he reports no recent complaints. He continues to take  Zytiga with prednisone without any complaints and denied any nausea, abdominal pain or lower extremity edema. He continues to be active and perform activities of daily living. He reports no hot flashes or bone pain since last visit. His appetite remain about the same without any major changes. He denied any pathological fractures, bone pain or decline in his performance status.  He does not report any headaches, blurry  vision, syncope or seizures. He does not report any fevers or chills or sweats. He does not report any cough, wheezing or hemoptysis. Does not report any nausea, vomiting or abdominal pain. He does not report any constipation, diarrhea or change in his bowel habits. He does not report any frequency urgency or hesitancy. He does not report any skeletal complaints. Remaining review of systems unremarkable.  Medications: I have reviewed the patient's current medications.  Current Outpatient Prescriptions  Medication Sig Dispense Refill  . amLODipine (NORVASC) 10 MG tablet Take 10 mg by mouth daily.    Marland Kitchen gemfibrozil (LOPID) 600 MG tablet Take 600 mg by mouth 2 (two) times daily before a meal.    . predniSONE (DELTASONE) 5 MG tablet TAKE 1 TABLET BY MOUTH DAILY WITH BREAKFAST. 30 tablet 0  . ZYTIGA 250 MG tablet TAKE 4 TABLETS BY MOUTH DAILY. TAKE ON AN EMPTY STOMACH 1 HOUR BEFORE OR 2 HOURS AFTER A MEAL 120 tablet 0   No current facility-administered medications for this visit.      Allergies: No Known Allergies  Past Medical History, Surgical history, Social history, and Family History were reviewed and updated.   Physical Exam: Blood pressure 117/69, pulse 81, temperature 97.9 F (36.6 C), temperature source Oral, resp. rate 16, height 5\' 10"  (1.778 m), weight 197 lb 8 oz (89.6 kg), SpO2 100 %. ECOG: 0 General appearance: Alert, awake woman without distress. Head: Normocephalic, without obvious abnormality no oral ulcers or lesions. Neck: no adenopathy Lymph nodes: Cervical, supraclavicular, and axillary nodes normal. Heart:regular  rate and rhythm, S1, S2 normal, no murmur, click, rub or gallop Lung:chest clear, no wheezing, rales, normal symmetric air entry.  Abdomin: soft, non-tender, without masses or organomegaly no rebound or guarding. EXT:no erythema, induration, or nodules   Lab Results: Lab Results  Component Value Date   WBC 6.0 07/03/2016   HGB 12.9 (L) 07/03/2016   HCT  38.6 07/03/2016   MCV 90.2 07/03/2016   PLT 190 07/03/2016     Chemistry      Component Value Date/Time   NA 138 05/15/2016 1053   K 4.5 05/15/2016 1053   CO2 26 05/15/2016 1053   BUN 22.5 05/15/2016 1053   CREATININE 1.3 05/15/2016 1053      Component Value Date/Time   CALCIUM 10.3 05/15/2016 1053   ALKPHOS 95 05/15/2016 1053   AST 22 05/15/2016 1053   ALT 20 05/15/2016 1053   BILITOT 0.33 05/15/2016 1053      Results for EZYKIEL, LYNDE (MRN [id number]) as of 07/03/2016 11:25  Ref. Range 04/03/2016 09:05 05/15/2016 10:53  PSA Latest Ref Range: 0.0 - 4.0 ng/mL <0.1 <0.1       Impression and Plan:  71 year old gentleman with the following issues:  1. Prostate cancer diagnosed in 2010. His PSA and Gleason score was unclear at the time of diagnosis, he had developed a recurrence in 2013 with a Gleason score of 4+3 = 7 and subsequently in 2016 with a Gleason score of 4+4 = 8. He was treated with a cryoablation on 2 separate occasions and most recently developed documented advanced disease with hypermetabolic right-sided pelvic sidewall lymph node. This was biopsy-proven on 12/15/2015.   He is currently on androgen deprivation therapy with Zytiga because of his high risk disease. His PSA remains close to 0 at this time without any major changes. Risks and benefits of continuing this medication were reviewed and is agreeable to continue.  2. Hypertension: His blood pressure will continue to be within normal range.  3. Hypokalemia: His potassium will be checked today and periodically and replaced. Potassium has been within normal range.  4. Androgen depravation therapy: I recommended to continue this indefinitely. He is receiving it with Dr. Gaynelle Arabian.  5. Follow-up: Will be in 6 weeks with a clinical visit. This can be done sooner if he experiences any difficulties.  [facility], MD 1/3/201811:34 AM

## 2016-07-03 NOTE — Progress Notes (Signed)
Gave patient 30 day supply of zytiga samples

## 2016-07-03 NOTE — Telephone Encounter (Signed)
Oral Chemotherapy Pharmacist Encounter  Successfully enrolled patient for foundation copayment support from [facility] [employer] [facility]) for prostate cancer. Award amount: [id number] Award dates: 04/04/16-07/02/17 ID: [id number] Group: [id number] BINID:[id number] PCN: [id number]  Billing information will be faxed to [place] to place on patient's file. A copy of the billing information will be placed in scanning folder to be scanned into patient's chart. I LVM for patient with good news.  Johny Drilling, PharmD, BCPS, BCOP 07/03/2016  4:23 PM Oral Oncology [facility] [phone]

## 2016-07-03 NOTE — Telephone Encounter (Signed)
Appointments scheduled per 07/03/16 los.  Patient was given a copy of the AVS report and appointment schedule, per 07/03/16 los. °

## 2016-07-04 LAB — PSA: Prostate Specific Ag, Serum: <0.1 ng/mL (ref 0.0–4.0)

## 2016-07-04 NOTE — Progress Notes (Signed)
Spoke with patient, gave results of last PSA 

## 2016-07-31 ENCOUNTER — Other Ambulatory Visit: Payer: Self-pay | Admitting: Oncology

## 2016-07-31 MED FILL — predniSONE 5 MG TABS: 5 | 30 days supply | Qty: 30 | Fill #0

## 2016-07-31 MED FILL — ZYTIGA 250 MG TABLET: 250 | 30 days supply | Qty: 120 | Fill #0

## 2016-08-23 ENCOUNTER — Other Ambulatory Visit: Payer: Self-pay | Admitting: *Deleted

## 2016-08-23 ENCOUNTER — Encounter: Payer: Self-pay | Admitting: *Deleted

## 2016-08-23 MED ORDER — PREDNISONE 5 MG PO TABS: 5.0000 mg | ORAL_TABLET | Freq: Every day | ORAL | 2 refills | Status: DC

## 2016-09-02 ENCOUNTER — Other Ambulatory Visit: Payer: Self-pay | Admitting: Oncology

## 2016-09-02 MED FILL — ZYTIGA 250 MG TABLET: 250 | 30 days supply | Qty: 120 | Fill #0

## 2016-09-03 ENCOUNTER — Telehealth: Payer: Self-pay | Admitting: Oncology

## 2016-09-03 ENCOUNTER — Other Ambulatory Visit (HOSPITAL_BASED_OUTPATIENT_CLINIC_OR_DEPARTMENT_OTHER): Payer: Medicare HMO

## 2016-09-03 ENCOUNTER — Ambulatory Visit (HOSPITAL_BASED_OUTPATIENT_CLINIC_OR_DEPARTMENT_OTHER): Payer: Medicare HMO | Admitting: Oncology

## 2016-09-03 VITALS — BP 122/82 | HR 94 | Temp 98.2°F | Resp 18 | Ht 70.0 in | Wt 193.7 lb

## 2016-09-03 DIAGNOSIS — C775 Secondary and unspecified malignant neoplasm of intrapelvic lymph nodes: Secondary | ICD-10-CM

## 2016-09-03 DIAGNOSIS — E291 Testicular hypofunction: Secondary | ICD-10-CM | POA: Diagnosis not present

## 2016-09-03 DIAGNOSIS — I10 Essential (primary) hypertension: Secondary | ICD-10-CM | POA: Diagnosis not present

## 2016-09-03 DIAGNOSIS — C61 Malignant neoplasm of prostate: Secondary | ICD-10-CM

## 2016-09-03 LAB — CBC WITH DIFFERENTIAL/PLATELET
BASO%: 0.5 % (ref 0.0–2.0)
Basophils Absolute: 0 10*3/uL (ref 0.0–0.1)
EOS ABS: 0.1 10*3/uL (ref 0.0–0.5)
EOS%: 2.3 % (ref 0.0–7.0)
HCT: 42.7 % (ref 38.4–49.9)
HEMOGLOBIN: 14.4 g/dL (ref 13.0–17.1)
LYMPH%: 16.7 % (ref 14.0–49.0)
MCH: 30.6 pg (ref 27.2–33.4)
MCHC: 33.7 g/dL (ref 32.0–36.0)
MCV: 90.8 fL (ref 79.3–98.0)
MONO#: 0.6 10*3/uL (ref 0.1–0.9)
MONO%: 9.8 % (ref 0.0–14.0)
NEUT%: 70.7 % (ref 39.0–75.0)
NEUTROS ABS: 4.5 10*3/uL (ref 1.5–6.5)
Platelets: 198 10*3/uL (ref 140–400)
RBC: 4.71 10*6/uL (ref 4.20–5.82)
RDW: 13.7 % (ref 11.0–14.6)
WBC: 6.4 10*3/uL (ref 4.0–10.3)
lymph#: 1.1 10*3/uL (ref 0.9–3.3)

## 2016-09-03 LAB — COMPREHENSIVE METABOLIC PANEL
ALBUMIN: 3.9 g/dL (ref 3.5–5.0)
ALK PHOS: 95 U/L (ref 40–150)
ALT: 22 U/L (ref 0–55)
ANION GAP: 11 meq/L (ref 3–11)
AST: 22 U/L (ref 5–34)
BILIRUBIN TOTAL: 0.4 mg/dL (ref 0.20–1.20)
BUN: 27.5 mg/dL — ABNORMAL HIGH (ref 7.0–26.0)
CO2: 26 mEq/L (ref 22–29)
Calcium: 11 mg/dL — ABNORMAL HIGH (ref 8.4–10.4)
Chloride: 105 mEq/L (ref 98–109)
Creatinine: 1.4 mg/dL — ABNORMAL HIGH (ref 0.7–1.3)
EGFR: 59 mL/min/{1.73_m2} — AB (ref >90)
GLUCOSE: 80 mg/dL (ref 70–140)
Potassium: 3.7 mEq/L (ref 3.5–5.1)
Sodium: 142 mEq/L (ref 136–145)
TOTAL PROTEIN: 9 g/dL — AB (ref 6.4–8.3)

## 2016-09-03 NOTE — Telephone Encounter (Signed)
Appointments scheduled per 3/6 LOS. Patient given AVS report and calendars with future scheduled appointments. °

## 2016-09-03 NOTE — Progress Notes (Signed)
Hematology and Oncology Follow Up Visit  James [name] [id number] 01-10-1946 71 y.o. 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 His initial diagnosis was in 2010. His Gleason score 3+4 = 7.   Prior Therapy: He was diagnosed with prostate cancer dating back to 2010 and his PSA and Gleason score was unclear.  He was treated with radiation therapy as a definitive modality well living in [place].  He developed a PSA recurrence in 2013 and his PSA was up to 2.9. He had a repeat biopsy at that time which showed a Gleason score 3+4 = 7. He was treated with cryoablation at that time.  In 2016 he had a elevated PSA up to 3.2 and a repeat biopsy in March 2016 showed a Gleason score 4+3 = 7 as well as a another foci of Gleason score 4+4 = 8. He underwent a repeat cryotherapy done while he was living in [place].  PSA went up to 2.96 and a PET scan obtained on 12/01/2015 showed marked hypermetabolic right pelvic sidewall lymph node consistent with metastatic disease. He underwent a biopsy on 12/15/2015 and the biopsy confirmed the presence of metastatic prostate cancer   Current therapy:  Androgen deprivation therapy intermittently given by James [name]. Zytiga started in July 2017.  Interim History:  James [name] presents today for a follow-up visit. Since the last visit, he continues to do very well without any major changes. He continues to take  Zytiga with prednisone without any recent side effects. He denied any nausea, abdominal pain or lower extremity edema. He continues to be active and perform activities of daily living. He reports no hot flashes or bone pain since last visit. He is monitoring his diet more carefully and have lost 4 pounds intentionally.  He does not report any headaches, blurry vision, syncope or seizures. He does not  report any fevers or chills or sweats. He does not report any cough, wheezing or hemoptysis. Does not report any nausea, vomiting or abdominal pain. He does not report any constipation, diarrhea or change in his bowel habits. He does not report any frequency urgency or hesitancy. He does not report any skeletal complaints. Remaining review of systems unremarkable.  Medications: I have reviewed the patient's current medications.  Current Outpatient Prescriptions  Medication Sig Dispense Refill  . amLODipine (NORVASC) 10 MG tablet Take 10 mg by mouth daily.    Marland Kitchen gemfibrozil (LOPID) 600 MG tablet Take 600 mg by mouth 2 (two) times daily before a meal.    . predniSONE (DELTASONE) 5 MG tablet Take 1 tablet (5 mg total) by mouth daily with breakfast. 30 tablet 2  . ZYTIGA 250 MG tablet TAKE 4 TABLETS BY MOUTH DAILY. TAKE ON AN EMPTY STOMACH 1 HOUR BEFORE OR 2 HOURS AFTER A MEAL 120 tablet 0   No current facility-administered medications for this visit.      Allergies: No Known Allergies  Past Medical History, Surgical history, Social history, and Family History were reviewed and updated.   Physical Exam: Blood pressure 122/82, pulse 94, temperature 98.2 F (36.8 C), temperature source Oral, resp. rate 18, height 5\' 10"  (1.778 m), weight 193 lb 11.2 oz (87.9 kg), SpO2 100 %. ECOG: 0 General appearance: Alert, awake showed without distress. Head: Normocephalic, without obvious abnormality no oral ulcers or lesions. Neck: no adenopathy Lymph nodes: Cervical, supraclavicular, and axillary nodes normal. Heart:regular  rate and rhythm, S1, S2 normal, no murmur, click, rub or gallop Lung:chest clear, no wheezing, rales, normal symmetric air entry.  Abdomin: soft, non-tender, without masses or organomegaly no rebound or guarding. EXT:no erythema, induration, or nodules   Lab Results: Lab Results  Component Value Date   WBC 6.4 09/03/2016   HGB 14.4 09/03/2016   HCT 42.7 09/03/2016   MCV 90.8  09/03/2016   PLT 198 09/03/2016     Chemistry      Component Value Date/Time   NA 140 07/03/2016 1059   K 4.5 07/03/2016 1059   CO2 26 07/03/2016 1059   BUN 24.2 07/03/2016 1059   CREATININE 1.2 07/03/2016 1059      Component Value Date/Time   CALCIUM 10.1 07/03/2016 1059   ALKPHOS 96 07/03/2016 1059   AST 24 07/03/2016 1059   ALT 24 07/03/2016 1059   BILITOT 0.23 07/03/2016 1059        Results for James [name], James [name] (MRN [id number]) as of 09/03/2016 09:29  Ref. Range 05/15/2016 10:53 07/03/2016 10:59  PSA Latest Ref Range: 0.0 - 4.0 ng/mL <0.1 <0.1      Impression and Plan:  71 year old gentleman with the following issues:  1. Prostate cancer diagnosed in 2010. His PSA and Gleason score was unclear at the time of diagnosis, he had developed a recurrence in 2013 with a Gleason score of 4+3 = 7 and subsequently in 2016 with a Gleason score of 4+4 = 8. He was treated with a cryoablation on 2 separate occasions and most recently developed documented advanced disease with hypermetabolic right-sided pelvic sidewall lymph node. This was biopsy-proven on 12/15/2015.   He is currently on androgen deprivation therapy with Zytiga because of his high risk disease.   His PSA continues to be undetectable with excellent response to therapy.  2. Hypertension: His blood pressure will continue to be within normal range.  3. Hypokalemia: His potassium continues to be within normal range.  4. Androgen depravation therapy: I recommended to continue this indefinitely. He is receiving it with James [name].  5. Follow-up: Will be in 3 months sooner if needed to.  [name], MD 3/6/20189:41 AM

## 2016-09-04 ENCOUNTER — Telehealth: Payer: Self-pay | Admitting: *Deleted

## 2016-09-04 LAB — PSA: Prostate Specific Ag, Serum: <0.1 ng/mL (ref 0.0–4.0)

## 2016-09-04 NOTE — Telephone Encounter (Signed)
As noted below by Dr. Alen Blew, I informed patient of his PSA level. Instructed patient to call [place] if he had any questions or concerns.

## 2016-09-04 NOTE — Telephone Encounter (Signed)
-----   Message from Wyatt Portela, MD sent at 09/04/2016  8:37 AM EST ----- Please let him know PSA still low.

## 2016-09-27 ENCOUNTER — Other Ambulatory Visit: Payer: Self-pay | Admitting: Oncology

## 2016-09-27 MED FILL — ZYTIGA 250 MG TABLET: 250 | 30 days supply | Qty: 120 | Fill #0

## 2016-10-31 ENCOUNTER — Other Ambulatory Visit: Payer: Self-pay | Admitting: Oncology

## 2016-10-31 MED FILL — ZYTIGA 250 MG TABLET: 250 | 30 days supply | Qty: 120 | Fill #0

## 2016-11-20 ENCOUNTER — Telehealth: Payer: Self-pay | Admitting: *Deleted

## 2016-11-20 ENCOUNTER — Encounter: Payer: Self-pay | Admitting: *Deleted

## 2016-11-20 NOTE — Telephone Encounter (Signed)
Faxed 09/03/16  o.v. Notes and labs to [facility]

## 2016-11-26 ENCOUNTER — Other Ambulatory Visit: Payer: Self-pay | Admitting: Oncology

## 2016-11-26 MED FILL — ZYTIGA 250 MG TABLET: 250 | 30 days supply | Qty: 120 | Fill #0

## 2016-11-27 ENCOUNTER — Other Ambulatory Visit: Payer: Self-pay | Admitting: *Deleted

## 2016-11-27 ENCOUNTER — Other Ambulatory Visit: Payer: Self-pay | Admitting: Oncology

## 2016-11-27 MED ORDER — PREDNISONE 5 MG PO TABS: 5.0000 mg | ORAL_TABLET | Freq: Every day | ORAL | 1 refills | Status: DC

## 2016-11-27 MED FILL — predniSONE 5 MG TABS: 5 | 90 days supply | Qty: 90 | Fill #0

## 2016-12-04 ENCOUNTER — Telehealth: Payer: Self-pay | Admitting: Oncology

## 2016-12-04 ENCOUNTER — Ambulatory Visit (HOSPITAL_BASED_OUTPATIENT_CLINIC_OR_DEPARTMENT_OTHER): Payer: Medicare HMO | Admitting: Oncology

## 2016-12-04 ENCOUNTER — Other Ambulatory Visit (HOSPITAL_BASED_OUTPATIENT_CLINIC_OR_DEPARTMENT_OTHER): Payer: Medicare HMO

## 2016-12-04 VITALS — BP 114/84 | HR 65 | Temp 98.3°F | Resp 20 | Ht 70.0 in | Wt 195.2 lb

## 2016-12-04 DIAGNOSIS — C61 Malignant neoplasm of prostate: Secondary | ICD-10-CM

## 2016-12-04 DIAGNOSIS — E291 Testicular hypofunction: Secondary | ICD-10-CM

## 2016-12-04 DIAGNOSIS — I10 Essential (primary) hypertension: Secondary | ICD-10-CM

## 2016-12-04 DIAGNOSIS — E876 Hypokalemia: Secondary | ICD-10-CM

## 2016-12-04 DIAGNOSIS — C775 Secondary and unspecified malignant neoplasm of intrapelvic lymph nodes: Secondary | ICD-10-CM

## 2016-12-04 LAB — CBC WITH DIFFERENTIAL/PLATELET
BASO%: 0.4 % (ref 0.0–2.0)
Basophils Absolute: 0 10*3/uL (ref 0.0–0.1)
EOS%: 1.8 % (ref 0.0–7.0)
Eosinophils Absolute: 0.1 10*3/uL (ref 0.0–0.5)
HEMATOCRIT: 39.5 % (ref 38.4–49.9)
HGB: 13.6 g/dL (ref 13.0–17.1)
LYMPH#: 0.9 10*3/uL (ref 0.9–3.3)
LYMPH%: 13.4 % — ABNORMAL LOW (ref 14.0–49.0)
MCH: 30.9 pg (ref 27.2–33.4)
MCHC: 34.3 g/dL (ref 32.0–36.0)
MCV: 90.2 fL (ref 79.3–98.0)
MONO#: 0.6 10*3/uL (ref 0.1–0.9)
MONO%: 8.6 % (ref 0.0–14.0)
NEUT%: 75.8 % — ABNORMAL HIGH (ref 39.0–75.0)
NEUTROS ABS: 5.3 10*3/uL (ref 1.5–6.5)
Platelets: 185 10*3/uL (ref 140–400)
RBC: 4.38 10*6/uL (ref 4.20–5.82)
RDW: 13.1 % (ref 11.0–14.6)
WBC: 7 10*3/uL (ref 4.0–10.3)

## 2016-12-04 LAB — COMPREHENSIVE METABOLIC PANEL
ALT: 33 U/L (ref 0–55)
ANION GAP: 7 meq/L (ref 3–11)
AST: 25 U/L (ref 5–34)
Albumin: 3.9 g/dL (ref 3.5–5.0)
Alkaline Phosphatase: 117 U/L (ref 40–150)
BILIRUBIN TOTAL: 0.46 mg/dL (ref 0.20–1.20)
BUN: 20.2 mg/dL (ref 7.0–26.0)
CO2: 27 meq/L (ref 22–29)
CREATININE: 1.2 mg/dL (ref 0.7–1.3)
Calcium: 10.1 mg/dL (ref 8.4–10.4)
Chloride: 104 mEq/L (ref 98–109)
EGFR: 70 mL/min/{1.73_m2} — ABNORMAL LOW (ref >90)
Glucose: 89 mg/dl (ref 70–140)
Potassium: 4.2 mEq/L (ref 3.5–5.1)
Sodium: 138 mEq/L (ref 136–145)
TOTAL PROTEIN: 8.3 g/dL (ref 6.4–8.3)

## 2016-12-04 NOTE — Telephone Encounter (Signed)
Appointments scheduled, per 12/04/16 los. Patient was given a copy of the AVS report and appointment schedule, per 12/04/16 los.

## 2016-12-04 NOTE — Progress Notes (Signed)
Hematology and Oncology Follow Up Visit  James [name] [id number] Dec 08, 1945 71 y.o. 12/04/2016 9:43 AM [name], MDJones, [name], MD   Principle Diagnosis: 71 year old gentleman with advanced prostate cancer with disease involving lymphadenopathy that is biopsy proven in June 2017. His initial diagnosis was in 2010. His Gleason score 3+4 = 7.   Prior Therapy: He was diagnosed with prostate cancer dating back to 2010 and his PSA and Gleason score was unclear.  He was treated with radiation therapy as a definitive modality well living in [place].  He developed a PSA recurrence in 2013 and his PSA was up to 2.9. He had a repeat biopsy at that time which showed a Gleason score 3+4 = 7. He was treated with cryoablation at that time.  In 2016 he had a elevated PSA up to 3.2 and a repeat biopsy in March 2016 showed a Gleason score 4+3 = 7 as well as a another foci of Gleason score 4+4 = 8. He underwent a repeat cryotherapy done while he was living in [place].  PSA went up to 2.96 and a PET scan obtained on 12/01/2015 showed marked hypermetabolic right pelvic sidewall lymph node consistent with metastatic disease. He underwent a biopsy on 12/15/2015 and the biopsy confirmed the presence of metastatic prostate cancer   Current therapy:  Androgen deprivation therapy intermittently given by Dr. Gaynelle Arabian. Zytiga started in July 2017.  Interim History:  James [name] presents today for a follow-up visit. Since the last visit, he reports no major changes in his health. He continues to take  Zytiga with prednisone without any complications. He denied any nausea, abdominal pain or lower extremity edema. He continues to be active and perform activities of daily living. He reports no hot flashes or bone pain since last visit. His performance status and quality of life remains excellent.  He does not report any headaches, blurry vision, syncope or seizures. He does not report any fevers or chills or sweats.  He does not report any cough, wheezing or hemoptysis. Does not report any nausea, vomiting or abdominal pain. He does not report any constipation, diarrhea or change in his bowel habits. He does not report any frequency urgency or hesitancy. He does not report any skeletal complaints. Remaining review of systems unremarkable.  Medications: I have reviewed the patient's current medications.  Current Outpatient Prescriptions  Medication Sig Dispense Refill  . amLODipine (NORVASC) 10 MG tablet Take 10 mg by mouth daily.    Marland Kitchen gemfibrozil (LOPID) 600 MG tablet Take 600 mg by mouth 2 (two) times daily before a meal.    . predniSONE (DELTASONE) 5 MG tablet Take 1 tablet (5 mg total) by mouth daily with breakfast. 90 tablet 1  . ZYTIGA 250 MG tablet TAKE 4 TABLETS BY MOUTH DAILY. TAKE ON AN EMPTY STOMACH 1 HOUR BEFORE OR 2 HOURS AFTER A MEAL 120 tablet 0   No current facility-administered medications for this visit.      Allergies: No Known Allergies  Past Medical History, Surgical history, Social history, and Family History were reviewed and updated.   Physical Exam: Blood pressure 114/84, pulse 65, temperature 98.3 F (36.8 C), temperature source Oral, resp. rate 20, height 5\' 10"  (1.778 m), weight 195 lb 3.2 oz (88.5 kg), SpO2 100 %. ECOG: 0 General appearance: Well-appearing gentleman appeared without distress. Head: Normocephalic, without obvious abnormality no oral ulcers or thrush. Neck: no adenopathy Lymph nodes: Cervical, supraclavicular, and axillary nodes normal. Heart:regular rate and rhythm, S1, S2 normal, no murmur,  click, rub or gallop Lung:chest clear, no wheezing, rales, normal symmetric air entry.  Abdomin: soft, non-tender, without masses or organomegaly  EXT:no erythema, induration, or nodules   Lab Results: Lab Results  Component Value Date   WBC 7.0 12/04/2016   HGB 13.6 12/04/2016   HCT 39.5 12/04/2016   MCV 90.2 12/04/2016   PLT 185 12/04/2016     Chemistry       Component Value Date/Time   NA 142 09/03/2016 0902   K 3.7 09/03/2016 0902   CO2 26 09/03/2016 0902   BUN 27.5 (H) 09/03/2016 0902   CREATININE 1.4 (H) 09/03/2016 0902      Component Value Date/Time   CALCIUM 11.0 (H) 09/03/2016 0902   ALKPHOS 95 09/03/2016 0902   AST 22 09/03/2016 0902   ALT 22 09/03/2016 0902   BILITOT 0.40 09/03/2016 0902      Results for James [name] (MRN [id number]) as of 12/04/2016 09:26  Ref. Range 05/15/2016 10:53 07/03/2016 10:59 09/03/2016 09:02  PSA Latest Ref Range: 0.0 - 4.0 ng/mL <0.1 <0.1 <0.1         Impression and Plan:  71 year old gentleman with the following issues:  1. Prostate cancer diagnosed in 2010. His PSA and Gleason score was unclear at the time of diagnosis, he had developed a recurrence in 2013 with a Gleason score of 4+3 = 7 and subsequently in 2016 with a Gleason score of 4+4 = 8. He was treated with a cryoablation on 2 separate occasions and most recently developed documented advanced disease with hypermetabolic right-sided pelvic sidewall lymph node. This was biopsy-proven on 12/15/2015.   He is currently on androgen deprivation therapy with Zytiga due to his high risk hormone sensitive prostate cancer.  His PSA continues to be undetectable with excellent response to therapy. The plan is to continue with the same dose and schedule without any modifications.  2. Hypertension: His blood pressure will continue to be within normal range.  3. Hypokalemia: His potassium in March 2018 was within normal range.  4. Androgen depravation therapy: I recommended to continue this indefinitely. He is receiving it with Dr. Gaynelle Arabian.  5. Hypercalcemia: Very mild elevation noted that is asymptomatic. Continue to monitor this moving forward.  6. Follow-up: Will be in 3 months.   [facility], MD 6/6/20189:43 AM

## 2016-12-05 ENCOUNTER — Telehealth: Payer: Self-pay | Admitting: *Deleted

## 2016-12-05 LAB — PSA: Prostate Specific Ag, Serum: <0.1 ng/mL (ref 0.0–4.0)

## 2016-12-05 NOTE — Telephone Encounter (Signed)
Spoke with patient, gave last PSA results 

## 2016-12-05 NOTE — Telephone Encounter (Signed)
-----   Message from Wyatt Portela, MD sent at 12/05/2016  7:49 AM EDT ----- Please let him know his PSA still very low.

## 2016-12-25 ENCOUNTER — Other Ambulatory Visit: Payer: Self-pay | Admitting: Oncology

## 2016-12-25 MED FILL — ZYTIGA 250 MG TABLET: 250 | 30 days supply | Qty: 120 | Fill #0

## 2017-01-21 ENCOUNTER — Other Ambulatory Visit: Payer: Self-pay | Admitting: Oncology

## 2017-01-21 MED FILL — ZYTIGA 250 MG TABLET: 250 | 30 days supply | Qty: 120 | Fill #0

## 2017-02-04 ENCOUNTER — Other Ambulatory Visit: Payer: Self-pay | Admitting: Oncology

## 2017-02-17 ENCOUNTER — Other Ambulatory Visit: Payer: Self-pay | Admitting: Oncology

## 2017-02-17 MED FILL — ZYTIGA 250 MG TABLET: 250 | 30 days supply | Qty: 120 | Fill #0

## 2017-02-19 ENCOUNTER — Telehealth: Payer: Self-pay | Admitting: Pharmacy Technician

## 2017-02-19 NOTE — Telephone Encounter (Signed)
Oral Oncology Patient Advocate Encounter  Received notification from [facility] [place] that Mr. Wildes was eligible for re-enrollment in the Metastatic Prostate cancer fund today, 02/19/2017.    Funds are not open at the time, but I will continue to monitor and apply when the funding is available.  He still has $3964.90 remaining in his [employer] as of today.    I have spoken with the patient.  He is appreciative and understands the plan.    [name]. Melynda Keller, [place] [facility] [place] [phone] 02/19/2017 2:58 PM

## 2017-03-06 ENCOUNTER — Other Ambulatory Visit (HOSPITAL_BASED_OUTPATIENT_CLINIC_OR_DEPARTMENT_OTHER): Payer: Medicare HMO

## 2017-03-06 ENCOUNTER — Ambulatory Visit (HOSPITAL_BASED_OUTPATIENT_CLINIC_OR_DEPARTMENT_OTHER): Payer: Medicare HMO | Admitting: Oncology

## 2017-03-06 ENCOUNTER — Telehealth: Payer: Self-pay | Admitting: Oncology

## 2017-03-06 VITALS — BP 122/77 | HR 72 | Temp 98.7°F | Resp 20 | Ht 70.0 in | Wt 196.9 lb

## 2017-03-06 DIAGNOSIS — E291 Testicular hypofunction: Secondary | ICD-10-CM | POA: Diagnosis not present

## 2017-03-06 DIAGNOSIS — C61 Malignant neoplasm of prostate: Secondary | ICD-10-CM

## 2017-03-06 DIAGNOSIS — I10 Essential (primary) hypertension: Secondary | ICD-10-CM | POA: Diagnosis not present

## 2017-03-06 LAB — COMPREHENSIVE METABOLIC PANEL
ALT: 37 U/L (ref 0–55)
AST: 28 U/L (ref 5–34)
Albumin: 3.5 g/dL (ref 3.5–5.0)
Alkaline Phosphatase: 90 U/L (ref 40–150)
Anion Gap: 8 mEq/L (ref 3–11)
BILIRUBIN TOTAL: 0.37 mg/dL (ref 0.20–1.20)
BUN: 27.5 mg/dL — AB (ref 7.0–26.0)
CHLORIDE: 107 meq/L (ref 98–109)
CO2: 23 meq/L (ref 22–29)
CREATININE: 1.3 mg/dL (ref 0.7–1.3)
Calcium: 9.9 mg/dL (ref 8.4–10.4)
EGFR: 67 mL/min/{1.73_m2} — ABNORMAL LOW (ref >90)
GLUCOSE: 97 mg/dL (ref 70–140)
Potassium: 4.1 mEq/L (ref 3.5–5.1)
SODIUM: 139 meq/L (ref 136–145)
TOTAL PROTEIN: 8 g/dL (ref 6.4–8.3)

## 2017-03-06 LAB — CBC WITH DIFFERENTIAL/PLATELET
BASO%: 0.5 % (ref 0.0–2.0)
Basophils Absolute: 0 10*3/uL (ref 0.0–0.1)
EOS ABS: 0.2 10*3/uL (ref 0.0–0.5)
EOS%: 2.7 % (ref 0.0–7.0)
HCT: 39.5 % (ref 38.4–49.9)
HGB: 13.2 g/dL (ref 13.0–17.1)
LYMPH%: 14 % (ref 14.0–49.0)
MCH: 30.7 pg (ref 27.2–33.4)
MCHC: 33.4 g/dL (ref 32.0–36.0)
MCV: 91.9 fL (ref 79.3–98.0)
MONO#: 0.6 10*3/uL (ref 0.1–0.9)
MONO%: 9.5 % (ref 0.0–14.0)
NEUT%: 73.3 % (ref 39.0–75.0)
NEUTROS ABS: 4.9 10*3/uL (ref 1.5–6.5)
PLATELETS: 171 10*3/uL (ref 140–400)
RBC: 4.29 10*6/uL (ref 4.20–5.82)
RDW: 14.3 % (ref 11.0–14.6)
WBC: 6.6 10*3/uL (ref 4.0–10.3)
lymph#: 0.9 10*3/uL (ref 0.9–3.3)

## 2017-03-06 NOTE — Telephone Encounter (Signed)
Gave avs and calendar for December appointment °

## 2017-03-06 NOTE — Progress Notes (Signed)
Hematology and Oncology Follow Up Visit  James [name] [id number] 18-Dec-1945 71 y.o. 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 His initial diagnosis was in 2010. His Gleason score 3+4 = 7.   Prior Therapy: He was diagnosed with prostate cancer dating back to 2010 and his PSA and Gleason score was unclear.  He was treated with radiation therapy as a definitive modality well living in [place].  He developed a PSA recurrence in 2013 and his PSA was up to 2.9. He had a repeat biopsy at that time which showed a Gleason score 3+4 = 7. He was treated with cryoablation at that time.  In 2016 he had a elevated PSA up to 3.2 and a repeat biopsy in March 2016 showed a Gleason score 4+3 = 7 as well as a another foci of Gleason score 4+4 = 8. He underwent a repeat cryotherapy done while he was living in [place].  PSA went up to 2.96 and a PET scan obtained on 12/01/2015 showed marked hypermetabolic right pelvic sidewall lymph node consistent with metastatic disease. He underwent a biopsy on 12/15/2015 and the biopsy confirmed the presence of metastatic prostate cancer   Current therapy:  Androgen deprivation therapy given by Dr. Gaynelle Arabian. Zytiga started in July 2017.  Interim History:  James [name] presents today for a follow-up visit. Since the last visit, he continues to do very well without any complaints. He continues to take  Zytiga with prednisone without any complications. He denied any nausea, abdominal pain or lower extremity edema. He continues to be active and perform activities of daily living. He has no difficulties obtaining this medication and taking it on a regular basis. His quality of life remains excellent.  He does not report any headaches, blurry vision, syncope or seizures. He does not report any fevers or chills or sweats.  He does not report any cough, wheezing or hemoptysis. Does not report any nausea, vomiting or abdominal pain. He does not report any constipation, diarrhea or change in his bowel habits. He does not report any frequency urgency or hesitancy. He does not report any skeletal complaints. Remaining review of systems unremarkable.  Medications: I have reviewed the patient's current medications.  Current Outpatient Prescriptions  Medication Sig Dispense Refill  . amLODipine (NORVASC) 10 MG tablet Take 10 mg by mouth daily.    Marland Kitchen gemfibrozil (LOPID) 600 MG tablet Take 600 mg by mouth 2 (two) times daily before a meal.    . predniSONE (DELTASONE) 5 MG tablet TAKE 1 TABLET EVERY DAY WITH BREAKFAST 90 tablet 2  . ZYTIGA 250 MG tablet TAKE 4 TABLETS BY MOUTH DAILY. TAKE ON AN EMPTY STOMACH 1 HOUR BEFORE OR 2 HOURS AFTER A MEAL 120 tablet 0   No current facility-administered medications for this visit.      Allergies: No Known Allergies  Past Medical History, Surgical history, Social history, and Family History were reviewed and updated.   Physical Exam: Blood pressure 122/77, pulse 72, temperature 98.7 F (37.1 C), temperature source Oral, resp. rate 20, height 5\' 10"  (1.778 m), weight 196 lb 14.4 oz (89.3 kg), SpO2 100 %. ECOG: 0 General appearance: Alert, awake gentleman without distress. Head: Normocephalic, without obvious abnormality no oral ulcers or lesions. Neck: no adenopathy Lymph nodes: Cervical, supraclavicular, and axillary nodes normal. Heart:regular rate and rhythm, S1, S2 normal, no murmur, click, rub or gallop  Lung:chest clear, no wheezing, rales, normal symmetric air entry.  Abdomin: soft, non-tender, without masses or organomegaly  EXT:no erythema, induration, or nodules   Lab Results: Lab Results  Component Value Date   WBC 6.6 03/06/2017   HGB 13.2 03/06/2017   HCT 39.5 03/06/2017   MCV 91.9 03/06/2017   PLT 171 03/06/2017     Chemistry      Component Value  Date/Time   NA 138 12/04/2016 0909   K 4.2 12/04/2016 0909   CO2 27 12/04/2016 0909   BUN 20.2 12/04/2016 0909   CREATININE 1.2 12/04/2016 0909      Component Value Date/Time   CALCIUM 10.1 12/04/2016 0909   ALKPHOS 117 12/04/2016 0909   AST 25 12/04/2016 0909   ALT 33 12/04/2016 0909   BILITOT 0.46 12/04/2016 0909       Results for James, [name] (MRN [id number]) as of 03/06/2017 10:46  Ref. Range 07/03/2016 10:59 09/03/2016 09:02 12/04/2016 09:09  Prostate Specific Ag, Serum Latest Ref Range: 0.0 - 4.0 ng/mL <0.1 <0.1 <0.1         Impression and Plan:  71 year old gentleman with the following issues:  1. Prostate cancer diagnosed in 2010. His PSA and Gleason score was unclear at the time of diagnosis, he had developed a recurrence in 2013 with a Gleason score of 4+3 = 7 and subsequently in 2016 with a Gleason score of 4+4 = 8. He was treated with a cryoablation on 2 separate occasions and most recently developed documented advanced disease with hypermetabolic right-sided pelvic sidewall lymph node. This was biopsy-proven on 12/15/2015.   He is currently on androgen deprivation therapy with Zytiga due to his high risk hormone sensitive prostate cancer.  Risks and benefits of continuing this medication was reviewed today and is agreeable to continue. His PSA continues to be under excellent response.  2. Hypertension: His blood pressure will continue to be within normal range.  3. Hypokalemia: His potassium in June 2018 was normal.  4. Androgen depravation therapy: I recommended to continue this indefinitely. He is receiving it with Dr. Gaynelle Arabian.  5. Hypercalcemia: His calcium is normal at this time.  6. Follow-up: Will be in 3 months.   Zola Button, MD 9/6/201810:55 AM

## 2017-03-07 ENCOUNTER — Telehealth: Payer: Self-pay | Admitting: *Deleted

## 2017-03-07 LAB — PSA

## 2017-03-07 NOTE — Telephone Encounter (Signed)
-----   Message from Wyatt Portela, MD sent at 03/07/2017  8:32 AM EDT ----- Please let him know his PSA is very low.

## 2017-03-07 NOTE — Telephone Encounter (Signed)
Notified of message below

## 2017-03-21 ENCOUNTER — Other Ambulatory Visit (HOSPITAL_COMMUNITY): Payer: Self-pay | Admitting: Pharmacy Technician

## 2017-03-21 ENCOUNTER — Other Ambulatory Visit: Payer: Self-pay | Admitting: Oncology

## 2017-03-21 MED FILL — ZYTIGA 250 MG TABLET: 250 | 30 days supply | Qty: 120 | Fill #0

## 2017-04-02 ENCOUNTER — Encounter: Payer: Self-pay | Admitting: *Deleted

## 2017-04-16 NOTE — Telephone Encounter (Signed)
Oral Oncology Patient Advocate Encounter  Was successful in securing patient an $ 6500.00 grant from [name] ([employer]) to provide copayment coverage for his Zytiga.  This will continue keep the out of pocket expense at $0.    I have left a message for the patient.    The billing information is as follows and has been shared with [facility].   Member ID: [id number] Group ID: [id number] RxBin: [id number] Dates of Eligibility: 02/20/2017 through 04/14/2018  [name]. Melynda Keller, [place] Patient James [name] [phone] 04/16/2017 11:41 AM

## 2017-04-22 ENCOUNTER — Other Ambulatory Visit: Payer: Self-pay | Admitting: Oncology

## 2017-04-22 MED FILL — ZYTIGA 250 MG TABLET: 250 | 30 days supply | Qty: 120 | Fill #0

## 2017-05-09 ENCOUNTER — Telehealth: Payer: Self-pay | Admitting: Pharmacist

## 2017-05-09 NOTE — Telephone Encounter (Signed)
Oral Chemotherapy Pharmacist Encounter  Follow-Up Form  Called patient today to follow up regarding patient's oral chemotherapy medication: Zytiga (abiraterone) in conjunction with prednisone, for the treatment of metastatic, castration-sensitive prostate cancer, planned duration until disease progression or unacceptable toxicity.  Original Start date of oral chemotherapy: 02/21/16  Pt is doing well today  Pt reports 0 tablets/doses of Zytiga 250mg  tablets, 4 tablets (1000mg ) by mouth once daily on an empty stomach, 1 hour before or 2 hours after food missed in the last month.  Patient states he takes his Zytiga at 5am daily and prednisone 5mg  tablet at 6pm daily with breakfast.  Pt reports the following side effects: mild fatigue, headaches at the start of therapy but they have subsided  Pertinent labs reviewed: OK for treatment.  Other Issues: Last ADT injection late 2017. Discussed with MD, will likely check testosterone level at 06/05/17 appt and make a decision about discussion addition at that point.  Patient knows to call the office with questions or concerns. Oral Oncology Clinic will continue to follow.  Thank you,  Johny Drilling, PharmD, BCPS, BCOP 05/09/2017 3:47 PM Oral Oncology Clinic [phone]

## 2017-05-19 ENCOUNTER — Other Ambulatory Visit: Payer: Self-pay | Admitting: Oncology

## 2017-05-23 MED FILL — ZYTIGA 250 MG TABLET: 250 | 30 days supply | Qty: 120 | Fill #0

## 2017-06-05 ENCOUNTER — Telehealth: Payer: Self-pay | Admitting: Oncology

## 2017-06-05 ENCOUNTER — Other Ambulatory Visit (HOSPITAL_BASED_OUTPATIENT_CLINIC_OR_DEPARTMENT_OTHER): Payer: Medicare HMO

## 2017-06-05 ENCOUNTER — Ambulatory Visit (HOSPITAL_BASED_OUTPATIENT_CLINIC_OR_DEPARTMENT_OTHER): Payer: Medicare HMO | Admitting: Oncology

## 2017-06-05 VITALS — BP 127/83 | HR 63 | Temp 98.3°F | Resp 18 | Ht 70.0 in | Wt 202.9 lb

## 2017-06-05 DIAGNOSIS — C61 Malignant neoplasm of prostate: Secondary | ICD-10-CM | POA: Diagnosis not present

## 2017-06-05 DIAGNOSIS — C775 Secondary and unspecified malignant neoplasm of intrapelvic lymph nodes: Secondary | ICD-10-CM

## 2017-06-05 DIAGNOSIS — E291 Testicular hypofunction: Secondary | ICD-10-CM

## 2017-06-05 DIAGNOSIS — I10 Essential (primary) hypertension: Secondary | ICD-10-CM | POA: Diagnosis not present

## 2017-06-05 LAB — CBC WITH DIFFERENTIAL/PLATELET
BASO%: 0.1 % (ref 0.0–2.0)
BASOS ABS: 0 10*3/uL (ref 0.0–0.1)
EOS ABS: 0.1 10*3/uL (ref 0.0–0.5)
EOS%: 1.8 % (ref 0.0–7.0)
HCT: 40.8 % (ref 38.4–49.9)
HGB: 13.5 g/dL (ref 13.0–17.1)
LYMPH%: 16.5 % (ref 14.0–49.0)
MCH: 30.3 pg (ref 27.2–33.4)
MCHC: 33.1 g/dL (ref 32.0–36.0)
MCV: 91.7 fL (ref 79.3–98.0)
MONO#: 0.5 10*3/uL (ref 0.1–0.9)
MONO%: 7.2 % (ref 0.0–14.0)
NEUT#: 5.2 10*3/uL (ref 1.5–6.5)
NEUT%: 74.4 % (ref 39.0–75.0)
Platelets: 176 10*3/uL (ref 140–400)
RBC: 4.45 10*6/uL (ref 4.20–5.82)
RDW: 14.2 % (ref 11.0–14.6)
WBC: 7 10*3/uL (ref 4.0–10.3)
lymph#: 1.2 10*3/uL (ref 0.9–3.3)

## 2017-06-05 LAB — COMPREHENSIVE METABOLIC PANEL
ALT: 63 U/L — AB (ref 0–55)
AST: 35 U/L — AB (ref 5–34)
Albumin: 3.6 g/dL (ref 3.5–5.0)
Alkaline Phosphatase: 78 U/L (ref 40–150)
Anion Gap: 8 mEq/L (ref 3–11)
BUN: 25.2 mg/dL (ref 7.0–26.0)
CHLORIDE: 105 meq/L (ref 98–109)
CO2: 27 meq/L (ref 22–29)
CREATININE: 1.3 mg/dL (ref 0.7–1.3)
Calcium: 9.9 mg/dL (ref 8.4–10.4)
EGFR: >60 mL/min/{1.73_m2} (ref >60)
Glucose: 84 mg/dl (ref 70–140)
Potassium: 4 mEq/L (ref 3.5–5.1)
Sodium: 140 mEq/L (ref 136–145)
Total Bilirubin: 0.37 mg/dL (ref 0.20–1.20)
Total Protein: 7.9 g/dL (ref 6.4–8.3)

## 2017-06-05 NOTE — Telephone Encounter (Signed)
Scheduled appt per 12/6 los - Gave patient AVS and calender per los 

## 2017-06-05 NOTE — Progress Notes (Signed)
Hematology and Oncology Follow Up Visit  James [name] [id number] 1946-05-28 71 y.o. 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 His initial diagnosis was in 2010. His Gleason score 3+4 = 7.   Prior Therapy: He was diagnosed with prostate cancer dating back to 2010 and his PSA and Gleason score was unclear.  He was treated with radiation therapy as a definitive modality well living in [place].  He developed a PSA recurrence in 2013 and his PSA was up to 2.9. He had a repeat biopsy at that time which showed a Gleason score 3+4 = 7. He was treated with cryoablation at that time.  In 2016 he had a elevated PSA up to 3.2 and a repeat biopsy in March 2016 showed a Gleason score 4+3 = 7 as well as a another foci of Gleason score 4+4 = 8. He underwent a repeat cryotherapy done while he was living in [place].  PSA went up to 2.96 and a PET scan obtained on 12/01/2015 showed marked hypermetabolic right pelvic sidewall lymph node consistent with metastatic disease. He underwent a biopsy on 12/15/2015 and the biopsy confirmed the presence of metastatic prostate cancer   Current therapy:  Androgen deprivation therapy given by Dr. Gaynelle [name].  This will be given at the cancer center starting December 2018. Zytiga started in July 2017.  Interim History:  [name] presents today for a follow-up visit. Since the last visit, he reports no changes in his health. He continues to take  Zytiga with prednisone without any complications. He denied any nausea, abdominal pain or lower extremity edema.  He denies any arthralgias or myalgias.  He denies any difficulties obtaining this medication or taken it on a regular basis.  His quality of life remain excellent.  He reestablish care with Dr. Lovena [name] at [facility] although he did not receive any hormone  therapy at this time.  He does not report any headaches, blurry vision, syncope or seizures. He does not report any fevers or chills or sweats. He does not report any cough, wheezing or hemoptysis. Does not report any nausea, vomiting or abdominal pain. He does not report any constipation, diarrhea or change in his bowel habits. He does not report any frequency urgency or hesitancy. He does not report any skeletal complaints. Remaining review of systems unremarkable.  Medications: I have reviewed the patient's current medications.  Current Outpatient Medications  Medication Sig Dispense Refill  . amLODipine (NORVASC) 10 MG tablet Take 10 mg by mouth daily.    Marland Kitchen gemfibrozil (LOPID) 600 MG tablet Take 600 mg by mouth 2 (two) times daily before a meal.    . predniSONE (DELTASONE) 5 MG tablet TAKE 1 TABLET EVERY DAY WITH BREAKFAST 90 tablet 2  . ZYTIGA 250 MG tablet TAKE 4 TABLETS BY MOUTH DAILY. TAKE ON AN EMPTY STOMACH 1 HOUR BEFORE OR 2 HOURS AFTER A MEAL 120 tablet 0   No current facility-administered medications for this visit.      Allergies: No Known Allergies  Past Medical History, Surgical history, Social history, and Family History were reviewed and updated.   Physical Exam: Blood pressure 127/83, pulse 63, temperature 98.3 F (36.8 C), temperature source Oral, resp. rate 18, height 5\' 10"  (1.778 m), weight 202 lb 14.4 oz (92 kg), SpO2 100 %. ECOG: 0 General appearance: Well-appearing gentleman without distress. Head: Normocephalic, without obvious abnormality no  oral ulcers or lesions. Neck: no adenopathy Lymph nodes: Cervical, supraclavicular, and axillary nodes normal. Heart:regular rate and rhythm, S1, S2 normal, no murmur, click, rub or gallop Lung:chest clear, no wheezing, rales, normal symmetric air entry.  Abdomin: soft, non-tender, without masses or organomegaly no shifting dullness or ascites. EXT:no erythema, induration, or nodules   Lab Results: Lab Results   Component Value Date   WBC 7.0 06/05/2017   HGB 13.5 06/05/2017   HCT 40.8 06/05/2017   MCV 91.7 06/05/2017   PLT 176 06/05/2017     Chemistry      Component Value Date/Time   NA 139 03/06/2017 1030   K 4.1 03/06/2017 1030   CO2 23 03/06/2017 1030   BUN 27.5 (H) 03/06/2017 1030   CREATININE 1.3 03/06/2017 1030      Component Value Date/Time   CALCIUM 9.9 03/06/2017 1030   ALKPHOS 90 03/06/2017 1030   AST 28 03/06/2017 1030   ALT 37 03/06/2017 1030   BILITOT 0.37 03/06/2017 1030          Results for James [name], James [name] (MRN [id number]) as of 06/05/2017 10:16  Ref. Range 12/04/2016 09:09 03/06/2017 10:34  Prostate Specific Ag, Serum Latest Ref Range: 0.0 - 4.0 ng/mL <0.1 <0.1      Impression and Plan:  71 year old gentleman with the following issues:  1. Prostate cancer diagnosed in 2010. His PSA and Gleason score was unclear at the time of diagnosis, he had developed a recurrence in 2013 with a Gleason score of 4+3 = 7 and subsequently in 2016 with a Gleason score of 4+4 = 8. He was treated with a cryoablation on 2 separate occasions and most recently developed documented advanced disease with hypermetabolic right-sided pelvic sidewall lymph node. This was biopsy-proven on 12/15/2015.   He is currently on androgen deprivation therapy with Zytiga due to his high risk hormone sensitive prostate cancer.  He continues to have excellent response to therapy with PSA remains undetectable.  Risks and benefits of continuing this medication was reviewed today and is agreeable to continue.  2. Hypertension: His blood pressure remains normal without any changes.  3. Hypokalemia: His potassium in September 2018 is normal.  4. Androgen depravation therapy: I recommended to continue this indefinitely. He will receive Lupron at 30 mg in 05/2017 and repeated every 4 months.  Risks and benefits of this medication was discussed today and will be resumed at the cancer center moving forward.  5.  Hypercalcemia: His calcium is normal at this time.  6. Follow-up: Will be in 4 months.   James Button, MD 12/6/201810:30 AM

## 2017-06-06 ENCOUNTER — Telehealth: Payer: Self-pay | Admitting: *Deleted

## 2017-06-06 LAB — PSA: Prostate Specific Ag, Serum: <0.1 ng/mL (ref 0.0–4.0)

## 2017-06-06 NOTE — Telephone Encounter (Signed)
-----   Message from Wyatt Portela, MD sent at 06/06/2017  8:02 AM EST ----- Please let him know his PSA is very low.

## 2017-06-06 NOTE — Telephone Encounter (Signed)
Spoke with patient, gave results of last PSA 

## 2017-06-12 ENCOUNTER — Ambulatory Visit (HOSPITAL_BASED_OUTPATIENT_CLINIC_OR_DEPARTMENT_OTHER): Payer: Medicare HMO

## 2017-06-12 VITALS — BP 129/79 | HR 74 | Temp 97.9°F | Resp 20

## 2017-06-12 DIAGNOSIS — C775 Secondary and unspecified malignant neoplasm of intrapelvic lymph nodes: Secondary | ICD-10-CM | POA: Diagnosis not present

## 2017-06-12 DIAGNOSIS — C61 Malignant neoplasm of prostate: Secondary | ICD-10-CM

## 2017-06-12 DIAGNOSIS — Z5111 Encounter for antineoplastic chemotherapy: Secondary | ICD-10-CM

## 2017-06-12 MED ORDER — LEUPROLIDE ACETATE (4 MONTH) 30 MG IM KIT
30.0000 mg | PACK | Freq: Once | INTRAMUSCULAR | Status: AC
  Administered 2017-06-12: 30 mg via INTRAMUSCULAR
  Filled 2017-06-12: qty 30

## 2017-06-12 NOTE — Patient Instructions (Signed)
Leuprolide depot injection What is this medicine? LEUPROLIDE (loo PROE lide) is a man-made protein that acts like a natural hormone in the body. It decreases testosterone in men and decreases estrogen in women. In men, this medicine is used to treat advanced prostate cancer. In women, some forms of this medicine may be used to treat endometriosis, uterine fibroids, or other male hormone-related problems. This medicine may be used for other purposes; ask your health care provider or pharmacist if you have questions. COMMON BRAND NAME(S): Eligard, Lupron Depot, Lupron Depot-Ped, Viadur What should I tell my health care provider before I take this medicine? They need to know if you have any of these conditions: -diabetes -heart disease or previous heart attack -high blood pressure -high cholesterol -mental illness -osteoporosis -pain or difficulty passing urine -seizures -spinal cord metastasis -stroke -suicidal thoughts, plans, or attempt; a previous suicide attempt by you or a family member -tobacco smoker -unusual vaginal bleeding (women) -an unusual or allergic reaction to leuprolide, benzyl alcohol, other medicines, foods, dyes, or preservatives -pregnant or trying to get pregnant -breast-feeding How should I use this medicine? This medicine is for injection into a muscle or for injection under the skin. It is given by a health care professional in a hospital or clinic setting. The specific product will determine how it will be given to you. Make sure you understand which product you receive and how often you will receive it. Talk to your pediatrician regarding the use of this medicine in children. Special care may be needed. Overdosage: If you think you have taken too much of this medicine contact a poison control center or emergency room at once. NOTE: This medicine is only for you. Do not share this medicine with others. What if I miss a dose? It is important not to miss a dose.  Call your doctor or health care professional if you are unable to keep an appointment. Depot injections: Depot injections are given either once-monthly, every 12 weeks, every 16 weeks, or every 24 weeks depending on the product you are prescribed. The product you are prescribed will be based on if you are male or male, and your condition. Make sure you understand your product and dosing. What may interact with this medicine? Do not take this medicine with any of the following medications: -chasteberry This medicine may also interact with the following medications: -herbal or dietary supplements, like black cohosh or DHEA -male hormones, like estrogens or progestins and birth control pills, patches, rings, or injections -male hormones, like testosterone This list may not describe all possible interactions. Give your health care provider a list of all the medicines, herbs, non-prescription drugs, or dietary supplements you use. Also tell them if you smoke, drink alcohol, or use illegal drugs. Some items may interact with your medicine. What should I watch for while using this medicine? Visit your doctor or health care professional for regular checks on your progress. During the first weeks of treatment, your symptoms may get worse, but then will improve as you continue your treatment. You may get hot flashes, increased bone pain, increased difficulty passing urine, or an aggravation of nerve symptoms. Discuss these effects with your doctor or health care professional, some of them may improve with continued use of this medicine. Male patients may experience a menstrual cycle or spotting during the first months of therapy with this medicine. If this continues, contact your doctor or health care professional. What side effects may I notice from receiving this medicine? Side   effects that you should report to your doctor or health care professional as soon as possible: -allergic reactions like skin  rash, itching or hives, swelling of the face, lips, or tongue -breathing problems -chest pain -depression or memory disorders -pain in your legs or groin -pain at site where injected or implanted -seizures -severe headache -swelling of the feet and legs -suicidal thoughts or other mood changes -visual changes -vomiting Side effects that usually do not require medical attention (report to your doctor or health care professional if they continue or are bothersome): -breast swelling or tenderness -decrease in sex drive or performance -diarrhea -hot flashes -loss of appetite -muscle, joint, or bone pains -nausea -redness or irritation at site where injected or implanted -skin problems or acne This list may not describe all possible side effects. Call your doctor for medical advice about side effects. You may report side effects to FDA at 1-800-FDA-1088. Where should I keep my medicine? This drug is given in a hospital or clinic and will not be stored at home. NOTE: This sheet is a summary. It may not cover all possible information. If you have questions about this medicine, talk to your doctor, pharmacist, or health care provider.  2018 Elsevier/Gold Standard (2015-11-30 09:45:53)  

## 2017-06-16 ENCOUNTER — Other Ambulatory Visit: Payer: Self-pay | Admitting: Oncology

## 2017-06-18 MED FILL — ZYTIGA 250 MG TABLET: 250 | 30 days supply | Qty: 120 | Fill #0

## 2017-07-03 ENCOUNTER — Telehealth: Payer: Self-pay | Admitting: Pharmacy Technician

## 2017-07-03 NOTE — Telephone Encounter (Signed)
Oral Oncology Patient Advocate Encounter  Was successful in securing patient an $ 7,500 grant from [facility] [employer]) to provide copayment coverage for his Zytiga.  This will keep the out of pocket expense at $0.    I have left a message for the patient.    The billing information is as follows and has been shared with [place].   Member ID: [id number] Group ID: [id number] RxBin: [id number] Dates of Eligibility: 07/03/17 through 07/02/18  James [name]. Melynda Keller, [facility] Patient [place] [phone] 07/03/2017 10:44 AM

## 2017-07-16 ENCOUNTER — Other Ambulatory Visit: Payer: Self-pay | Admitting: Oncology

## 2017-07-18 MED FILL — ZYTIGA 250 MG TABLET: 250 | 30 days supply | Qty: 120 | Fill #0

## 2017-08-18 ENCOUNTER — Other Ambulatory Visit: Payer: Self-pay | Admitting: Oncology

## 2017-08-21 MED FILL — ZYTIGA 250 MG TABLET: 250 | 30 days supply | Qty: 120 | Fill #0

## 2017-09-09 ENCOUNTER — Other Ambulatory Visit: Payer: Self-pay | Admitting: *Deleted

## 2017-09-09 MED ORDER — PREDNISONE 5 MG PO TABS: 5.0000 mg | ORAL_TABLET | Freq: Every day | ORAL | 2 refills | Status: DC

## 2017-09-12 ENCOUNTER — Other Ambulatory Visit: Payer: Self-pay | Admitting: Oncology

## 2017-09-22 MED FILL — ZYTIGA 250 MG TABLET: 250 | 30 days supply | Qty: 120 | Fill #0

## 2017-10-01 ENCOUNTER — Inpatient Hospital Stay: Payer: Medicare HMO

## 2017-10-01 ENCOUNTER — Telehealth: Payer: Self-pay | Admitting: Oncology

## 2017-10-01 ENCOUNTER — Inpatient Hospital Stay (HOSPITAL_BASED_OUTPATIENT_CLINIC_OR_DEPARTMENT_OTHER): Payer: Medicare HMO | Admitting: Oncology

## 2017-10-01 ENCOUNTER — Inpatient Hospital Stay: Payer: Medicare HMO | Attending: Oncology

## 2017-10-01 VITALS — BP 139/76 | HR 57 | Temp 98.6°F | Resp 18 | Ht 70.0 in | Wt 207.2 lb

## 2017-10-01 DIAGNOSIS — Z923 Personal history of irradiation: Secondary | ICD-10-CM | POA: Insufficient documentation

## 2017-10-01 DIAGNOSIS — E876 Hypokalemia: Secondary | ICD-10-CM | POA: Diagnosis not present

## 2017-10-01 DIAGNOSIS — Z79818 Long term (current) use of other agents affecting estrogen receptors and estrogen levels: Secondary | ICD-10-CM | POA: Diagnosis not present

## 2017-10-01 DIAGNOSIS — Z191 Hormone sensitive malignancy status: Secondary | ICD-10-CM | POA: Diagnosis not present

## 2017-10-01 DIAGNOSIS — R609 Edema, unspecified: Secondary | ICD-10-CM | POA: Insufficient documentation

## 2017-10-01 DIAGNOSIS — C61 Malignant neoplasm of prostate: Secondary | ICD-10-CM

## 2017-10-01 DIAGNOSIS — Z79899 Other long term (current) drug therapy: Secondary | ICD-10-CM | POA: Diagnosis not present

## 2017-10-01 DIAGNOSIS — I10 Essential (primary) hypertension: Secondary | ICD-10-CM

## 2017-10-01 DIAGNOSIS — R634 Abnormal weight loss: Secondary | ICD-10-CM

## 2017-10-01 LAB — COMPREHENSIVE METABOLIC PANEL
ALK PHOS: 83 U/L (ref 40–150)
ALT: 40 U/L (ref 0–55)
AST: 33 U/L (ref 5–34)
Albumin: 3.7 g/dL (ref 3.5–5.0)
Anion gap: 8 (ref 3–11)
BUN: 20 mg/dL (ref 7–26)
CALCIUM: 10.2 mg/dL (ref 8.4–10.4)
CO2: 29 mmol/L (ref 22–29)
CREATININE: 1.2 mg/dL (ref 0.70–1.30)
Chloride: 104 mmol/L (ref 98–109)
GFR calc Af Amer: >60 mL/min (ref >60)
GFR, EST NON AFRICAN AMERICAN: 60 mL/min — AB (ref >60)
Glucose, Bld: 82 mg/dL (ref 70–140)
Potassium: 4.4 mmol/L (ref 3.5–5.1)
Sodium: 141 mmol/L (ref 136–145)
Total Bilirubin: 0.3 mg/dL (ref 0.2–1.2)
Total Protein: 7.8 g/dL (ref 6.4–8.3)

## 2017-10-01 LAB — CBC WITH DIFFERENTIAL (CANCER CENTER ONLY)
BASOS PCT: 0 %
Basophils Absolute: 0 10*3/uL (ref 0.0–0.1)
EOS ABS: 0.1 10*3/uL (ref 0.0–0.5)
EOS PCT: 2 %
HCT: 40.4 % (ref 38.4–49.9)
Hemoglobin: 13.3 g/dL (ref 13.0–17.1)
LYMPHS ABS: 1.2 10*3/uL (ref 0.9–3.3)
Lymphocytes Relative: 22 %
MCH: 30.4 pg (ref 27.2–33.4)
MCHC: 32.9 g/dL (ref 32.0–36.0)
MCV: 92.4 fL (ref 79.3–98.0)
MONOS PCT: 11 %
Monocytes Absolute: 0.6 10*3/uL (ref 0.1–0.9)
Neutro Abs: 3.5 10*3/uL (ref 1.5–6.5)
Neutrophils Relative %: 65 %
PLATELETS: 161 10*3/uL (ref 140–400)
RBC: 4.37 MIL/uL (ref 4.20–5.82)
RDW: 13.5 % (ref 11.0–14.6)
WBC: 5.4 10*3/uL (ref 4.0–10.3)

## 2017-10-01 MED ORDER — LEUPROLIDE ACETATE (4 MONTH) 30 MG IM KIT
30.0000 mg | PACK | Freq: Once | INTRAMUSCULAR | Status: AC
  Administered 2017-10-01: 30 mg via INTRAMUSCULAR
  Filled 2017-10-01: qty 30

## 2017-10-01 NOTE — Progress Notes (Signed)
Hematology and Oncology Follow Up Visit  James [name] [id number] 1945-10-21 72 y.o. 10/01/2017 10:16 AM [name], MDJones, [name], MD   Principle Diagnosis: 72 year old man with prostate cancer diagnosed in 2010.  He had a Gleason score 3+4 = 7 and subsequently developed hormone sensitive disease with lymphadenopathy that is biopsy proven in June 2017.  Prior Therapy: He was treated with radiation therapy as a definitive modality well living in [place].  He developed a PSA recurrence in 2013 and his PSA was up to 2.9. He had a repeat biopsy at that time which showed a Gleason score 3+4 = 7. He was treated with cryoablation at that time.  In 2016 he had a elevated PSA up to 3.2 and a repeat biopsy in March 2016 showed a Gleason score 4+3 = 7 as well as a another foci of Gleason score 4+4 = 8. He underwent a repeat cryotherapy done while he was living in [place].  PSA went up to 2.96 and a PET scan obtained on 12/01/2015 showed marked hypermetabolic right pelvic sidewall lymph node consistent with metastatic disease. 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   Zytiga started in July 2017.  Interim History:  [name] presents today for a follow-up.  Since last visit, he reports a few complaints predominantly related to prednisone.  He is reporting more weight gain and edema in his lower extremities.  He denies any fatigue or tiredness or other complications related to Zytiga.  He is trying to exercise regularly at this time.  His appetite remain excellent and have gained 4 pounds.  He denies any bone pain or pathological fractures.  He does not report any headaches, blurry vision, syncope or seizures. He does not report any fevers or chills or sweats. He does not report any cough, wheezing or hemoptysis. Does not report any nausea, vomiting or abdominal pain.  He does not report any constipation, diarrhea or change in his bowel habits. He does not report any frequency urgency or hesitancy. He does not report any skeletal complaints. Remaining review of systems is negative.  Medications: I have reviewed the patient's current medications.  Current Outpatient Medications  Medication Sig Dispense Refill  . amLODipine (NORVASC) 10 MG tablet Take 10 mg by mouth daily.    Marland Kitchen atorvastatin (LIPITOR) 40 MG tablet     . predniSONE (DELTASONE) 5 MG tablet Take 1 tablet (5 mg total) by mouth daily with breakfast. 90 tablet 2  . ZYTIGA 250 MG tablet TAKE 4 TABLETS BY MOUTH DAILY. TAKE ON AN EMPTY STOMACH 1 HOUR BEFORE OR 2 HOURS AFTER A MEAL 120 tablet 0   No current facility-administered medications for this visit.      Allergies: No Known Allergies  Past Medical History, Surgical history, Social history, and Family History remain unchanged.   Physical Exam: Blood pressure 139/76, pulse (!) 57, temperature 98.6 F (37 C), temperature source Oral, resp. rate 18, height 5\' 10"  (1.778 m), weight 207 lb 3.2 oz (94 kg), SpO2 100 %.   ECOG: 0 General appearance: Alert, awake gentleman without distress. Head: Atraumatic without abnormalities. Eyes: No scleral icterus. Oropharynx: Without any thrush or ulcers. Lymph nodes: Cervical, supraclavicular, and axillary nodes normal. Heart: Regular rate without any murmurs or gallops.  S1 and S2. Lung: Clear to auscultation without any rhonchi, wheezes or dullness to percussion. Abdomin: Soft, nontender without  any rebound or guarding.  No shifting dullness or ascites. Musculoskeletal: No joint deformity or effusion.  No clubbing or cyanosis. Skin: No rashes or lesions. Neurological: No deficits noted.   Lab Results: Lab Results  Component Value Date   WBC 7.0 06/05/2017   HGB 13.5 06/05/2017   HCT 40.8 06/05/2017   MCV 91.7 06/05/2017   PLT 176 06/05/2017     Chemistry      Component Value Date/Time    NA 140 06/05/2017 1003   K 4.0 06/05/2017 1003   CO2 27 06/05/2017 1003   BUN 25.2 06/05/2017 1003   CREATININE 1.3 06/05/2017 1003      Component Value Date/Time   CALCIUM 9.9 06/05/2017 1003   ALKPHOS 78 06/05/2017 1003   AST 35 (H) 06/05/2017 1003   ALT 63 (H) 06/05/2017 1003   BILITOT 0.37 06/05/2017 1003       Results for [name], [name] (MRN [id number]) as of 10/01/2017 10:16  Ref. Range 12/04/2016 09:09 03/06/2017 10:34 06/05/2017 10:03  Prostate Specific Ag, Serum Latest Ref Range: 0.0 - 4.0 ng/mL <0.1 <0.1 <0.1       Impression and Plan:  72 year old gentleman with the following issues:  1.  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  He was initially diagnosed in 2010.   He remains on Zytiga without any major complications.  He continues to have excellent response to therapy with PSA is no longer detectable.  Risks and benefits of continuing this medication long-term was discussed today.  Long-term complications that includes hypertension, electrolyte imbalance among others were discussed.  He is agreeable to continue at this time.  We will taper down his prednisone in the next 2 weeks for better tolerance.  He will take prednisone every other day for a week then twice a week for another week and will discontinue after that.  2. Hypertension: This needs to be monitored periodically on Zytiga.  His blood pressure remains  3. Hypokalemia: His potassium continues to close to normal range.  Will monitor him periodically on this treatment.  4. Androgen depravation therapy: He is currently receiving Lupron at 30 mg every 4 months.  Risks and benefits of continuing this long-term was reviewed today and this is to be continued indefinitely.  He will receive his next injection on 10/01/2017.  5. Hypercalcemia: His calcium is repeated today and currently pending.  6. Follow-up: Will be in 4 months for a follow-up visit and  repeat Lupron injection.   25  minutes was spent with the patient face-to-face today.  More than 50% of time was dedicated to patient counseling, education and coordination of the patient's multifaceted care.   Zola Button, MD 4/3/201910:16 AM

## 2017-10-01 NOTE — Telephone Encounter (Signed)
Scheduled appt per 4/3 los - Gave patient AVS and calender per los.  

## 2017-10-01 NOTE — Patient Instructions (Signed)
Leuprolide depot injection What is this medicine? LEUPROLIDE (loo PROE lide) is a man-made protein that acts like a natural hormone in the body. It decreases testosterone in men and decreases estrogen in women. In men, this medicine is used to treat advanced prostate cancer. In women, some forms of this medicine may be used to treat endometriosis, uterine fibroids, or other male hormone-related problems. This medicine may be used for other purposes; ask your health care provider or pharmacist if you have questions. COMMON BRAND NAME(S): Eligard, Lupron Depot, Lupron Depot-Ped, Viadur What should I tell my health care provider before I take this medicine? They need to know if you have any of these conditions: -diabetes -heart disease or previous heart attack -high blood pressure -high cholesterol -mental illness -osteoporosis -pain or difficulty passing urine -seizures -spinal cord metastasis -stroke -suicidal thoughts, plans, or attempt; a previous suicide attempt by you or a family member -tobacco smoker -unusual vaginal bleeding (women) -an unusual or allergic reaction to leuprolide, benzyl alcohol, other medicines, foods, dyes, or preservatives -pregnant or trying to get pregnant -breast-feeding How should I use this medicine? This medicine is for injection into a muscle or for injection under the skin. It is given by a health care professional in a hospital or clinic setting. The specific product will determine how it will be given to you. Make sure you understand which product you receive and how often you will receive it. Talk to your pediatrician regarding the use of this medicine in children. Special care may be needed. Overdosage: If you think you have taken too much of this medicine contact a poison control center or emergency room at once. NOTE: This medicine is only for you. Do not share this medicine with others. What if I miss a dose? It is important not to miss a dose.  Call your doctor or health care professional if you are unable to keep an appointment. Depot injections: Depot injections are given either once-monthly, every 12 weeks, every 16 weeks, or every 24 weeks depending on the product you are prescribed. The product you are prescribed will be based on if you are male or male, and your condition. Make sure you understand your product and dosing. What may interact with this medicine? Do not take this medicine with any of the following medications: -chasteberry This medicine may also interact with the following medications: -herbal or dietary supplements, like black cohosh or DHEA -male hormones, like estrogens or progestins and birth control pills, patches, rings, or injections -male hormones, like testosterone This list may not describe all possible interactions. Give your health care provider a list of all the medicines, herbs, non-prescription drugs, or dietary supplements you use. Also tell them if you smoke, drink alcohol, or use illegal drugs. Some items may interact with your medicine. What should I watch for while using this medicine? Visit your doctor or health care professional for regular checks on your progress. During the first weeks of treatment, your symptoms may get worse, but then will improve as you continue your treatment. You may get hot flashes, increased bone pain, increased difficulty passing urine, or an aggravation of nerve symptoms. Discuss these effects with your doctor or health care professional, some of them may improve with continued use of this medicine. Male patients may experience a menstrual cycle or spotting during the first months of therapy with this medicine. If this continues, contact your doctor or health care professional. What side effects may I notice from receiving this medicine? Side   effects that you should report to your doctor or health care professional as soon as possible: -allergic reactions like skin  rash, itching or hives, swelling of the face, lips, or tongue -breathing problems -chest pain -depression or memory disorders -pain in your legs or groin -pain at site where injected or implanted -seizures -severe headache -swelling of the feet and legs -suicidal thoughts or other mood changes -visual changes -vomiting Side effects that usually do not require medical attention (report to your doctor or health care professional if they continue or are bothersome): -breast swelling or tenderness -decrease in sex drive or performance -diarrhea -hot flashes -loss of appetite -muscle, joint, or bone pains -nausea -redness or irritation at site where injected or implanted -skin problems or acne This list may not describe all possible side effects. Call your doctor for medical advice about side effects. You may report side effects to FDA at 1-800-FDA-1088. Where should I keep my medicine? This drug is given in a hospital or clinic and will not be stored at home. NOTE: This sheet is a summary. It may not cover all possible information. If you have questions about this medicine, talk to your doctor, pharmacist, or health care provider.  2018 Elsevier/Gold Standard (2015-11-30 09:45:53)  

## 2017-10-02 ENCOUNTER — Telehealth: Payer: Self-pay | Admitting: *Deleted

## 2017-10-02 LAB — TESTOSTERONE: Testosterone: <3 ng/dL — ABNORMAL LOW (ref 264–916)

## 2017-10-02 LAB — PROSTATE-SPECIFIC AG, SERUM (LABCORP)

## 2017-10-02 NOTE — Telephone Encounter (Signed)
-----   Message from Wyatt Portela, MD sent at 10/02/2017  9:13 AM EDT ----- Please let him know his PSA is still very low

## 2017-10-02 NOTE — Telephone Encounter (Signed)
As noted below by Dr. Shadad, I informed patient of his PSA level. He verbalized understanding.  

## 2017-10-06 ENCOUNTER — Encounter: Payer: Self-pay | Admitting: *Deleted

## 2017-10-13 ENCOUNTER — Other Ambulatory Visit: Payer: Self-pay | Admitting: Pharmacist

## 2017-10-13 ENCOUNTER — Other Ambulatory Visit: Payer: Self-pay | Admitting: Oncology

## 2017-10-15 MED FILL — ZYTIGA 250 MG TABLET: 250 | 30 days supply | Qty: 120 | Fill #0

## 2017-11-19 ENCOUNTER — Other Ambulatory Visit: Payer: Self-pay | Admitting: Oncology

## 2017-11-25 MED FILL — ZYTIGA 250 MG TABLET: 250 | 30 days supply | Qty: 120 | Fill #0

## 2017-12-16 ENCOUNTER — Other Ambulatory Visit: Payer: Self-pay | Admitting: Oncology

## 2017-12-18 MED FILL — ZYTIGA 250 MG TABLET: 250 | 30 days supply | Qty: 120 | Fill #0

## 2018-01-12 ENCOUNTER — Other Ambulatory Visit: Payer: Self-pay | Admitting: Oncology

## 2018-01-15 MED FILL — ZYTIGA 250 MG TABLET: 250 | 30 days supply | Qty: 120 | Fill #0

## 2018-02-03 ENCOUNTER — Inpatient Hospital Stay: Payer: Medicare HMO

## 2018-02-03 ENCOUNTER — Inpatient Hospital Stay: Payer: Medicare HMO | Attending: Oncology

## 2018-02-03 ENCOUNTER — Telehealth: Payer: Self-pay

## 2018-02-03 ENCOUNTER — Inpatient Hospital Stay (HOSPITAL_BASED_OUTPATIENT_CLINIC_OR_DEPARTMENT_OTHER): Payer: Medicare HMO | Admitting: Oncology

## 2018-02-03 VITALS — BP 133/77 | HR 60 | Temp 98.6°F | Resp 17 | Ht 70.0 in | Wt 193.6 lb

## 2018-02-03 DIAGNOSIS — Z191 Hormone sensitive malignancy status: Secondary | ICD-10-CM | POA: Diagnosis not present

## 2018-02-03 DIAGNOSIS — C61 Malignant neoplasm of prostate: Secondary | ICD-10-CM | POA: Insufficient documentation

## 2018-02-03 DIAGNOSIS — Z79899 Other long term (current) drug therapy: Secondary | ICD-10-CM | POA: Insufficient documentation

## 2018-02-03 DIAGNOSIS — Z923 Personal history of irradiation: Secondary | ICD-10-CM | POA: Insufficient documentation

## 2018-02-03 DIAGNOSIS — I10 Essential (primary) hypertension: Secondary | ICD-10-CM

## 2018-02-03 DIAGNOSIS — Z79818 Long term (current) use of other agents affecting estrogen receptors and estrogen levels: Secondary | ICD-10-CM | POA: Insufficient documentation

## 2018-02-03 LAB — CBC WITH DIFFERENTIAL (CANCER CENTER ONLY)
BASOS ABS: 0 10*3/uL (ref 0.0–0.1)
Basophils Relative: 0 %
EOS ABS: 0.3 10*3/uL (ref 0.0–0.5)
EOS PCT: 6 %
HCT: 36.8 % — ABNORMAL LOW (ref 38.4–49.9)
Hemoglobin: 12.2 g/dL — ABNORMAL LOW (ref 13.0–17.1)
LYMPHS ABS: 1.7 10*3/uL (ref 0.9–3.3)
LYMPHS PCT: 35 %
MCH: 30.4 pg (ref 27.2–33.4)
MCHC: 33.2 g/dL (ref 32.0–36.0)
MCV: 91.8 fL (ref 79.3–98.0)
MONO ABS: 0.4 10*3/uL (ref 0.1–0.9)
Monocytes Relative: 9 %
Neutro Abs: 2.4 10*3/uL (ref 1.5–6.5)
Neutrophils Relative %: 50 %
PLATELETS: 147 10*3/uL (ref 140–400)
RBC: 4.01 MIL/uL — AB (ref 4.20–5.82)
RDW: 14.4 % (ref 11.0–14.6)
WBC: 4.8 10*3/uL (ref 4.0–10.3)

## 2018-02-03 LAB — CMP (CANCER CENTER ONLY)
ALT: 27 U/L (ref 0–44)
AST: 31 U/L (ref 15–41)
Albumin: 3.4 g/dL — ABNORMAL LOW (ref 3.5–5.0)
Alkaline Phosphatase: 111 U/L (ref 38–126)
Anion gap: 7 (ref 5–15)
BUN: 26 mg/dL — AB (ref 8–23)
CO2: 25 mmol/L (ref 22–32)
CREATININE: 1.15 mg/dL (ref 0.61–1.24)
Calcium: 9.9 mg/dL (ref 8.9–10.3)
Chloride: 109 mmol/L (ref 98–111)
GFR, Est AFR Am: >60 mL/min (ref >60)
GLUCOSE: 98 mg/dL (ref 70–99)
Potassium: 4.5 mmol/L (ref 3.5–5.1)
Sodium: 141 mmol/L (ref 135–145)
TOTAL PROTEIN: 7.7 g/dL (ref 6.5–8.1)
Total Bilirubin: 0.3 mg/dL (ref 0.3–1.2)

## 2018-02-03 MED ORDER — LEUPROLIDE ACETATE (4 MONTH) 30 MG IM KIT
30.0000 mg | PACK | Freq: Once | INTRAMUSCULAR | Status: AC
  Administered 2018-02-03: 30 mg via INTRAMUSCULAR
  Filled 2018-02-03: qty 30

## 2018-02-03 NOTE — Patient Instructions (Signed)
Leuprolide depot injection What is this medicine? LEUPROLIDE (loo PROE lide) is a man-made protein that acts like a natural hormone in the body. It decreases testosterone in men and decreases estrogen in women. In men, this medicine is used to treat advanced prostate cancer. In women, some forms of this medicine may be used to treat endometriosis, uterine fibroids, or other male hormone-related problems. This medicine may be used for other purposes; ask your health care provider or pharmacist if you have questions. COMMON BRAND NAME(S): Eligard, Lupron Depot, Lupron Depot-Ped, Viadur What should I tell my health care provider before I take this medicine? They need to know if you have any of these conditions: -diabetes -heart disease or previous heart attack -high blood pressure -high cholesterol -mental illness -osteoporosis -pain or difficulty passing urine -seizures -spinal cord metastasis -stroke -suicidal thoughts, plans, or attempt; a previous suicide attempt by you or a family member -tobacco smoker -unusual vaginal bleeding (women) -an unusual or allergic reaction to leuprolide, benzyl alcohol, other medicines, foods, dyes, or preservatives -pregnant or trying to get pregnant -breast-feeding How should I use this medicine? This medicine is for injection into a muscle or for injection under the skin. It is given by a health care professional in a hospital or clinic setting. The specific product will determine how it will be given to you. Make sure you understand which product you receive and how often you will receive it. Talk to your pediatrician regarding the use of this medicine in children. Special care may be needed. Overdosage: If you think you have taken too much of this medicine contact a poison control center or emergency room at once. NOTE: This medicine is only for you. Do not share this medicine with others. What if I miss a dose? It is important not to miss a dose.  Call your doctor or health care professional if you are unable to keep an appointment. Depot injections: Depot injections are given either once-monthly, every 12 weeks, every 16 weeks, or every 24 weeks depending on the product you are prescribed. The product you are prescribed will be based on if you are male or male, and your condition. Make sure you understand your product and dosing. What may interact with this medicine? Do not take this medicine with any of the following medications: -chasteberry This medicine may also interact with the following medications: -herbal or dietary supplements, like black cohosh or DHEA -male hormones, like estrogens or progestins and birth control pills, patches, rings, or injections -male hormones, like testosterone This list may not describe all possible interactions. Give your health care provider a list of all the medicines, herbs, non-prescription drugs, or dietary supplements you use. Also tell them if you smoke, drink alcohol, or use illegal drugs. Some items may interact with your medicine. What should I watch for while using this medicine? Visit your doctor or health care professional for regular checks on your progress. During the first weeks of treatment, your symptoms may get worse, but then will improve as you continue your treatment. You may get hot flashes, increased bone pain, increased difficulty passing urine, or an aggravation of nerve symptoms. Discuss these effects with your doctor or health care professional, some of them may improve with continued use of this medicine. Male patients may experience a menstrual cycle or spotting during the first months of therapy with this medicine. If this continues, contact your doctor or health care professional. What side effects may I notice from receiving this medicine? Side   effects that you should report to your doctor or health care professional as soon as possible: -allergic reactions like skin  rash, itching or hives, swelling of the face, lips, or tongue -breathing problems -chest pain -depression or memory disorders -pain in your legs or groin -pain at site where injected or implanted -seizures -severe headache -swelling of the feet and legs -suicidal thoughts or other mood changes -visual changes -vomiting Side effects that usually do not require medical attention (report to your doctor or health care professional if they continue or are bothersome): -breast swelling or tenderness -decrease in sex drive or performance -diarrhea -hot flashes -loss of appetite -muscle, joint, or bone pains -nausea -redness or irritation at site where injected or implanted -skin problems or acne This list may not describe all possible side effects. Call your doctor for medical advice about side effects. You may report side effects to FDA at 1-800-FDA-1088. Where should I keep my medicine? This drug is given in a hospital or clinic and will not be stored at home. NOTE: This sheet is a summary. It may not cover all possible information. If you have questions about this medicine, talk to your doctor, pharmacist, or health care provider.  2018 Elsevier/Gold Standard (2015-11-30 09:45:53)  

## 2018-02-03 NOTE — Telephone Encounter (Signed)
Printed avs and calender of upcoming appointment. Per 8/6 los 

## 2018-02-03 NOTE — Progress Notes (Signed)
Hematology and Oncology Follow Up Visit  James [name] [id number] [birth year] 72 y.o. 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 He had a Gleason score 3+4 = 7 in 2013 as initial diagnosis.    Prior Therapy: He was treated with radiation therapy as a definitive modality well living in [place].  He developed a PSA recurrence in 2013 and his PSA was up to 2.9. He had a repeat biopsy at that time which showed a Gleason score 3+4 = 7. He was treated with cryoablation at that time.  In 2016 he had a elevated PSA up to 3.2 and a repeat biopsy in March 2016 showed a Gleason score 4+3 = 7 as well as a another foci of Gleason score 4+4 = 8. He underwent a repeat cryotherapy done while he was living in [place].  PSA went up to 2.96 and a PET scan obtained on 12/01/2015 showed marked hypermetabolic right pelvic sidewall lymph node consistent with metastatic disease. 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    Zytiga started in July 2017.  Interim History:  James [name] is here for a follow-up visit.  Since the last visit, he reports no major changes in his health.  Continues to tolerate Zytiga without any new complications.  He denies excessive fatigue tiredness or lower extremity edema.  He has no difficulties obtaining this medication and adhering to it.  He denies any bone pain or pathological fractures.  Continues to exercise regularly and maintain reasonable healthy diet.   He does not report any headaches, blurry vision, syncope or seizures.  He denies any alteration in mental status or confusion.  He does not report any fevers or chills or sweats. He does not report any cough, wheezing or hemoptysis. Does not report any nausea, vomiting or  abdominal pain.  He denies any early satiety or change in his bowel habits.  He denies any hematochezia or melena. He does not report any frequency urgency or hesitancy. He does not report any bone pain or pathological fractures.  He denies any lymphadenopathy or petechiae.  Denies any easy bruising.. Remaining review of systems is negative.  Medications: I have reviewed the patient's current medications.  Current Outpatient Medications  Medication Sig Dispense Refill  . amLODipine (NORVASC) 10 MG tablet Take 10 mg by mouth daily.    Marland Kitchen atorvastatin (LIPITOR) 40 MG tablet     . predniSONE (DELTASONE) 5 MG tablet Take 1 tablet (5 mg total) by mouth daily with breakfast. 90 tablet 2  . ZYTIGA 250 MG tablet TAKE 4 TABLETS (1000 MG) BY MOUTH DAILY. TAKE ON AN EMPTY STOMACH 1 HOUR BEFORE OR 2 HOURS AFTER A MEAL 120 tablet 0   No current facility-administered medications for this visit.      Allergies: No Known Allergies  Past Medical History, Surgical history, Social history, and Family History remain unchanged.   Physical Exam: Blood pressure 133/77, [name] 60, temperature 98.6 F (37 C), temperature source Oral, resp. rate 17, height 5\' 10"  (1.778 m), weight 193 lb 9.6 oz (87.8 kg), SpO2 99 %.   ECOG: 0 General appearance: Well-appearing gentleman without distress. Head: Normal cephalic without abnormalities. Eyes: Pulls are equal and round reactive to light. Oropharynx: No oral thrush or ulcers. Lymph nodes: No lymphadenopathy noted in  the cervical, supraclavicular, or axillary nodes Heart: Regular rate and rhythm without murmurs.  S1-S2 and no leg edema. Lung: Clear that any rhonchi, wheezes or dullness to percussion. Abdomin: Soft, not any rebound or guarding.  No shifting dullness or ascites. Musculoskeletal: No clubbing or cyanosis. Skin: No ecchymosis or petechiae. Neurological: No motor or sensory deficits.   Lab Results: Lab Results  Component Value Date   WBC 4.8  02/03/2018   HGB 12.2 (L) 02/03/2018   HCT 36.8 (L) 02/03/2018   MCV 91.8 02/03/2018   PLT 147 02/03/2018     Chemistry      Component Value Date/Time   NA 141 10/01/2017 1005   NA 140 06/05/2017 1003   K 4.4 10/01/2017 1005   K 4.0 06/05/2017 1003   CL 104 10/01/2017 1005   CO2 29 10/01/2017 1005   CO2 27 06/05/2017 1003   BUN 20 10/01/2017 1005   BUN 25.2 06/05/2017 1003   CREATININE 1.20 10/01/2017 1005   CREATININE 1.3 06/05/2017 1003      Component Value Date/Time   CALCIUM 10.2 10/01/2017 1005   CALCIUM 9.9 06/05/2017 1003   ALKPHOS 83 10/01/2017 1005   ALKPHOS 78 06/05/2017 1003   AST 33 10/01/2017 1005   AST 35 (H) 06/05/2017 1003   ALT 40 10/01/2017 1005   ALT 63 (H) 06/05/2017 1003   BILITOT 0.3 10/01/2017 1005   BILITOT 0.37 06/05/2017 1003         Results for [name], [name] (MRN [id number]) as of 02/03/2018 11:56  Ref. Range 03/06/2017 10:34 06/05/2017 10:03 10/01/2017 10:05  Prostate Specific Ag, Serum Latest Ref Range: 0.0 - 4.0 ng/mL <0.1 <0.1 <0.1      Impression and Plan:  72 year old man with:  1.  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    He continues to take Zytiga without any major complications.  His last PSA remains in optimal.  Risks and benefits of continuing this treatments were reviewed today including the natural course of this disease and alternative treatment options.  After discussion today he is willing to continue to take this medication for the time being and will repeat imaging studies of his PSA arise.   2. Hypertension: Blood pressure is within normal range today.  3. Hypokalemia: Potassium will be repeated periodically and it was within normal range on October 01, 2017.  4. Androgen depravation therapy: Risks and benefits of continuing this therapy was reviewed today and is agreeable to continue.  He will receive Lupron every 4 months indefinitely.  5. Hypercalcemia: His  calcium has been within normal range on prior visits.  6. Follow-up: Will be in 4 months.   15  minutes was spent with the patient face-to-face today.  More than 50% of time was dedicated to discussing the natural course of this disease and answering questions regarding future plan of care.  Zola Button, MD 8/6/201911:56 AM

## 2018-02-04 ENCOUNTER — Telehealth: Payer: Self-pay | Admitting: *Deleted

## 2018-02-04 LAB — PROSTATE-SPECIFIC AG, SERUM (LABCORP)

## 2018-02-04 NOTE — Telephone Encounter (Signed)
-----   Message from Wyatt Portela, MD sent at 02/04/2018  8:44 AM EDT ----- Please let him know his PSA is low

## 2018-02-04 NOTE — Telephone Encounter (Signed)
As noted below by Dr. Shadad, I informed patient of his PSA level. He verbalized understanding.  

## 2018-02-13 ENCOUNTER — Other Ambulatory Visit: Payer: Self-pay | Admitting: Oncology

## 2018-02-16 MED FILL — ZYTIGA 250 MG TABLET: 250 | 30 days supply | Qty: 120 | Fill #0

## 2018-03-09 ENCOUNTER — Other Ambulatory Visit: Payer: Self-pay | Admitting: Oncology

## 2018-03-16 MED FILL — ZYTIGA 250 MG TABLET: 250 | 30 days supply | Qty: 120 | Fill #0

## 2018-04-06 ENCOUNTER — Other Ambulatory Visit: Payer: Self-pay | Admitting: Oncology

## 2018-04-10 MED FILL — ZYTIGA 250 MG TABLET: 250 | 30 days supply | Qty: 120 | Fill #0

## 2018-05-13 ENCOUNTER — Other Ambulatory Visit: Payer: Self-pay | Admitting: Oncology

## 2018-05-20 MED FILL — ZYTIGA 250 MG TABLET: 250 | 30 days supply | Qty: 120 | Fill #0

## 2018-06-02 ENCOUNTER — Inpatient Hospital Stay: Payer: Medicare HMO

## 2018-06-02 ENCOUNTER — Inpatient Hospital Stay (HOSPITAL_BASED_OUTPATIENT_CLINIC_OR_DEPARTMENT_OTHER): Payer: Medicare HMO | Admitting: Oncology

## 2018-06-02 ENCOUNTER — Inpatient Hospital Stay: Payer: Medicare HMO | Attending: Oncology

## 2018-06-02 ENCOUNTER — Telehealth: Payer: Self-pay

## 2018-06-02 VITALS — BP 139/80 | HR 61 | Temp 98.7°F | Resp 19 | Ht 70.0 in | Wt 190.3 lb

## 2018-06-02 DIAGNOSIS — E876 Hypokalemia: Secondary | ICD-10-CM | POA: Diagnosis not present

## 2018-06-02 DIAGNOSIS — I10 Essential (primary) hypertension: Secondary | ICD-10-CM | POA: Insufficient documentation

## 2018-06-02 DIAGNOSIS — R599 Enlarged lymph nodes, unspecified: Secondary | ICD-10-CM | POA: Diagnosis not present

## 2018-06-02 DIAGNOSIS — Z79899 Other long term (current) drug therapy: Secondary | ICD-10-CM | POA: Insufficient documentation

## 2018-06-02 DIAGNOSIS — Z79818 Long term (current) use of other agents affecting estrogen receptors and estrogen levels: Secondary | ICD-10-CM | POA: Insufficient documentation

## 2018-06-02 DIAGNOSIS — M810 Age-related osteoporosis without current pathological fracture: Secondary | ICD-10-CM

## 2018-06-02 DIAGNOSIS — Z923 Personal history of irradiation: Secondary | ICD-10-CM | POA: Diagnosis not present

## 2018-06-02 DIAGNOSIS — C61 Malignant neoplasm of prostate: Secondary | ICD-10-CM

## 2018-06-02 LAB — CBC WITH DIFFERENTIAL (CANCER CENTER ONLY)
Abs Immature Granulocytes: 0.01 10*3/uL (ref 0.00–0.07)
Basophils Absolute: 0 10*3/uL (ref 0.0–0.1)
Basophils Relative: 0 %
EOS PCT: 3 %
Eosinophils Absolute: 0.2 10*3/uL (ref 0.0–0.5)
HEMATOCRIT: 39.2 % (ref 39.0–52.0)
HEMOGLOBIN: 13 g/dL (ref 13.0–17.0)
Immature Granulocytes: 0 %
LYMPHS PCT: 32 %
Lymphs Abs: 1.5 10*3/uL (ref 0.7–4.0)
MCH: 29.9 pg (ref 26.0–34.0)
MCHC: 33.2 g/dL (ref 30.0–36.0)
MCV: 90.1 fL (ref 80.0–100.0)
MONO ABS: 0.6 10*3/uL (ref 0.1–1.0)
Monocytes Relative: 12 %
NEUTROS ABS: 2.4 10*3/uL (ref 1.7–7.7)
Neutrophils Relative %: 53 %
Platelet Count: 145 10*3/uL — ABNORMAL LOW (ref 150–400)
RBC: 4.35 MIL/uL (ref 4.22–5.81)
RDW: 13.4 % (ref 11.5–15.5)
WBC: 4.6 10*3/uL (ref 4.0–10.5)
nRBC: 0 % (ref 0.0–0.2)

## 2018-06-02 LAB — CMP (CANCER CENTER ONLY)
ALT: 25 U/L (ref 0–44)
AST: 33 U/L (ref 15–41)
Albumin: 3.5 g/dL (ref 3.5–5.0)
Alkaline Phosphatase: 104 U/L (ref 38–126)
Anion gap: 7 (ref 5–15)
BUN: 18 mg/dL (ref 8–23)
CHLORIDE: 105 mmol/L (ref 98–111)
CO2: 28 mmol/L (ref 22–32)
CREATININE: 1.29 mg/dL — AB (ref 0.61–1.24)
Calcium: 10.2 mg/dL (ref 8.9–10.3)
GFR, EST NON AFRICAN AMERICAN: 56 mL/min — AB (ref >60)
GFR, Est AFR Am: >60 mL/min (ref >60)
Glucose, Bld: 92 mg/dL (ref 70–99)
POTASSIUM: 4.9 mmol/L (ref 3.5–5.1)
Sodium: 140 mmol/L (ref 135–145)
Total Bilirubin: 0.4 mg/dL (ref 0.3–1.2)
Total Protein: 7.6 g/dL (ref 6.5–8.1)

## 2018-06-02 MED ORDER — LEUPROLIDE ACETATE (4 MONTH) 30 MG IM KIT
30.0000 mg | PACK | Freq: Once | INTRAMUSCULAR | Status: AC
  Administered 2018-06-02: 30 mg via INTRAMUSCULAR
  Filled 2018-06-02: qty 30

## 2018-06-02 NOTE — Progress Notes (Signed)
Hematology and Oncology Follow Up Visit  James [name] [id number] February 01, 1946 72 y.o. 06/02/2018 9:55 AM James [name], MDJones, [name], MD   Principle Diagnosis: 72 year old man with prostate cancer diagnosed in 2013 with a Gleason score 3+4 = 7.  He developed advanced castration-sensitive disease with pelvic lymphadenopathy documented in 2017.   Prior Therapy: He was treated with radiation therapy as a definitive modality well living in [place].  He developed a PSA recurrence in 2013 and his PSA was up to 2.9. He had a repeat biopsy at that time which showed a Gleason score 3+4 = 7. He was treated with cryoablation at that time.  In 2016 he had a elevated PSA up to 3.2 and a repeat biopsy in March 2016 showed a Gleason score 4+3 = 7 as well as a another foci of Gleason score 4+4 = 8. He underwent a repeat cryotherapy done while he was living in [place].  PSA went up to 2.96 and a PET scan obtained on 12/01/2015 showed marked hypermetabolic right pelvic sidewall lymph node consistent with metastatic disease. 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    Zytiga started in July 2017.  Interim History:  James [name] returns today for a follow-up.  Since the last visit, he reports no major changes in his health.  He continues to tolerate Zytiga without any concerns or complications.  He denies any excessive fatigue, tiredness.  He denies any lower extremity edema or increase in his blood pressure.  His performance status and quality of life remains excellent.  He does report urinary frequency which has not changed at this time.   He does not report any headaches, blurry vision, syncope or seizures.  He denies any confusion or lethargy.  He does not report any fevers or chills or sweats. He does not report any cough, wheezing or hemoptysis. Does not report any nausea,  vomiting or abdominal pain.  He denies any constipation or diarrhea.  He denies any hematochezia or melena. He does not report any frequency urgency or hesitancy. He does not report any arthralgias or myalgias.  He denies any bleeding or clotting tendency.  Denies any skin rashes or lesions.  Denies any mood changes. Remaining review of systems is negative.  Medications: I have reviewed the patient's current medications.  Current Outpatient Medications  Medication Sig Dispense Refill  . amLODipine (NORVASC) 10 MG tablet Take 10 mg by mouth daily.    Marland Kitchen atorvastatin (LIPITOR) 40 MG tablet     . predniSONE (DELTASONE) 5 MG tablet Take 1 tablet (5 mg total) by mouth daily with breakfast. 90 tablet 2  . ZYTIGA 250 MG tablet TAKE 4 TABLETS (1000 MG) BY MOUTH DAILY. TAKE ON AN EMPTY STOMACH 1 HOUR BEFORE OR 2 HOURS AFTER A MEAL 120 tablet 0   No current facility-administered medications for this visit.    Facility-Administered Medications Ordered in Other Visits  Medication Dose Route Frequency Provider Last Rate Last Dose  . leuprolide (LUPRON) injection 30 mg  30 mg Intramuscular Once James [name], Mathis Dad, MD         Allergies: No Known Allergies  Past Medical History, Surgical history, Social history, and Family History remain unchanged.   Physical Exam: Blood pressure 139/80, pulse 61, temperature 98.7 F (37.1 C), temperature source Oral, resp. rate 19, height 5\' 10"  (1.778 m), weight 190 lb 4.8  oz (86.3 kg), SpO2 100 %.   ECOG: 0   General appearance: Comfortable appearing without any discomfort Head: Normocephalic without any trauma Oropharynx: Mucous membranes are moist and pink without any thrush or ulcers. Eyes: Pupils are equal and round reactive to light. Lymph nodes: No cervical, supraclavicular, inguinal or axillary lymphadenopathy.   Heart:regular rate and rhythm.  S1 and S2 without leg edema. Lung: Clear without any rhonchi or wheezes.  No dullness to percussion. Abdomin:  Soft, nontender, nondistended with good bowel sounds.  No hepatosplenomegaly. Musculoskeletal: No joint deformity or effusion.  Full range of motion noted. Neurological: No deficits noted on motor, sensory and deep tendon reflex exam. Skin: No petechial rash or dryness.  Appeared moist.     Lab Results: Lab Results  Component Value Date   WBC 4.6 06/02/2018   HGB 13.0 06/02/2018   HCT 39.2 06/02/2018   MCV 90.1 06/02/2018   PLT 145 (L) 06/02/2018     Chemistry      Component Value Date/Time   NA 141 02/03/2018 1122   NA 140 06/05/2017 1003   K 4.5 02/03/2018 1122   K 4.0 06/05/2017 1003   CL 109 02/03/2018 1122   CO2 25 02/03/2018 1122   CO2 27 06/05/2017 1003   BUN 26 (H) 02/03/2018 1122   BUN 25.2 06/05/2017 1003   CREATININE 1.15 02/03/2018 1122   CREATININE 1.3 06/05/2017 1003      Component Value Date/Time   CALCIUM 9.9 02/03/2018 1122   CALCIUM 9.9 06/05/2017 1003   ALKPHOS 111 02/03/2018 1122   ALKPHOS 78 06/05/2017 1003   AST 31 02/03/2018 1122   AST 35 (H) 06/05/2017 1003   ALT 27 02/03/2018 1122   ALT 63 (H) 06/05/2017 1003   BILITOT 0.3 02/03/2018 1122   BILITOT 0.37 06/05/2017 1003          Results for James [name] (MRN [id number]) as of 06/02/2018 09:41  Ref. Range 02/03/2018 11:22  Prostate Specific Ag, Serum Latest Ref Range: 0.0 - 4.0 ng/mL <0.1      Impression and Plan:  72 year old man with:  1.  Advanced prostate cancer with pelvic adenopathy that is currently castration-sensitive documented in 2017.    He continues to tolerate Zytiga reasonably well without any complaints.  His PSA continues to be undetectable and his disease status was updated.  I recommended to repeat imaging studies of the abdomen and pelvis to ensure disease stability at this time.  Poorly differentiated tumors that do not produce PSA could progress despite therapy and repeat imaging studies would be mandatory.  He is agreeable to have repeat imaging studies in the  near future.  If his disease remains under control we will continue with the current therapy.   2. Hypertension: His blood pressure will continue to be monitored on Zytiga.  Blood pressure is adequate today.  3. Hypokalemia: No issues reported with his potassium.  Remains within normal range.  4. Androgen depravation therapy: He is currently on Lupron and that will be continued every 4 months indefinitely.  Long-term complications associated with this therapy was reviewed which include osteoporosis, hot flashes among others.  5. Follow-up: Will be in 4 months.   15  minutes was spent with the patient face-to-face today.  More than 50% of time was dedicated to reviewing his disease status, treatment options and coordinating plan of care.  Zola Button, MD 12/3/20199:55 AM

## 2018-06-02 NOTE — Telephone Encounter (Signed)
Printed avs and calender of upcoming appointment. Per 12/3 los 

## 2018-06-02 NOTE — Patient Instructions (Signed)
Leuprolide depot injection What is this medicine? LEUPROLIDE (loo PROE lide) is a man-made protein that acts like a natural hormone in the body. It decreases testosterone in men and decreases estrogen in women. In men, this medicine is used to treat advanced prostate cancer. In women, some forms of this medicine may be used to treat endometriosis, uterine fibroids, or other male hormone-related problems. This medicine may be used for other purposes; ask your health care provider or pharmacist if you have questions. COMMON BRAND NAME(S): Eligard, Lupron Depot, Lupron Depot-Ped, Viadur What should I tell my health care provider before I take this medicine? They need to know if you have any of these conditions: -diabetes -heart disease or previous heart attack -high blood pressure -high cholesterol -mental illness -osteoporosis -pain or difficulty passing urine -seizures -spinal cord metastasis -stroke -suicidal thoughts, plans, or attempt; a previous suicide attempt by you or a family member -tobacco smoker -unusual vaginal bleeding (women) -an unusual or allergic reaction to leuprolide, benzyl alcohol, other medicines, foods, dyes, or preservatives -pregnant or trying to get pregnant -breast-feeding How should I use this medicine? This medicine is for injection into a muscle or for injection under the skin. It is given by a health care professional in a hospital or clinic setting. The specific product will determine how it will be given to you. Make sure you understand which product you receive and how often you will receive it. Talk to your pediatrician regarding the use of this medicine in children. Special care may be needed. Overdosage: If you think you have taken too much of this medicine contact a poison control center or emergency room at once. NOTE: This medicine is only for you. Do not share this medicine with others. What if I miss a dose? It is important not to miss a dose.  Call your doctor or health care professional if you are unable to keep an appointment. Depot injections: Depot injections are given either once-monthly, every 12 weeks, every 16 weeks, or every 24 weeks depending on the product you are prescribed. The product you are prescribed will be based on if you are male or male, and your condition. Make sure you understand your product and dosing. What may interact with this medicine? Do not take this medicine with any of the following medications: -chasteberry This medicine may also interact with the following medications: -herbal or dietary supplements, like black cohosh or DHEA -male hormones, like estrogens or progestins and birth control pills, patches, rings, or injections -male hormones, like testosterone This list may not describe all possible interactions. Give your health care provider a list of all the medicines, herbs, non-prescription drugs, or dietary supplements you use. Also tell them if you smoke, drink alcohol, or use illegal drugs. Some items may interact with your medicine. What should I watch for while using this medicine? Visit your doctor or health care professional for regular checks on your progress. During the first weeks of treatment, your symptoms may get worse, but then will improve as you continue your treatment. You may get hot flashes, increased bone pain, increased difficulty passing urine, or an aggravation of nerve symptoms. Discuss these effects with your doctor or health care professional, some of them may improve with continued use of this medicine. Male patients may experience a menstrual cycle or spotting during the first months of therapy with this medicine. If this continues, contact your doctor or health care professional. What side effects may I notice from receiving this medicine? Side   effects that you should report to your doctor or health care professional as soon as possible: -allergic reactions like skin  rash, itching or hives, swelling of the face, lips, or tongue -breathing problems -chest pain -depression or memory disorders -pain in your legs or groin -pain at site where injected or implanted -seizures -severe headache -swelling of the feet and legs -suicidal thoughts or other mood changes -visual changes -vomiting Side effects that usually do not require medical attention (report to your doctor or health care professional if they continue or are bothersome): -breast swelling or tenderness -decrease in sex drive or performance -diarrhea -hot flashes -loss of appetite -muscle, joint, or bone pains -nausea -redness or irritation at site where injected or implanted -skin problems or acne This list may not describe all possible side effects. Call your doctor for medical advice about side effects. You may report side effects to FDA at 1-800-FDA-1088. Where should I keep my medicine? This drug is given in a hospital or clinic and will not be stored at home. NOTE: This sheet is a summary. It may not cover all possible information. If you have questions about this medicine, talk to your doctor, pharmacist, or health care provider.  2018 Elsevier/Gold Standard (2015-11-30 09:45:53)  

## 2018-06-03 ENCOUNTER — Telehealth: Payer: Self-pay | Admitting: *Deleted

## 2018-06-03 LAB — PROSTATE-SPECIFIC AG, SERUM (LABCORP)

## 2018-06-03 NOTE — Telephone Encounter (Signed)
Per dr Alen Blew, spoke with patient. Gave results of last PSA.

## 2018-06-03 NOTE — Telephone Encounter (Signed)
-----   Message from Wyatt Portela, MD sent at 06/03/2018  7:24 AM EST ----- Please let him know his PSA is low

## 2018-06-09 ENCOUNTER — Ambulatory Visit (HOSPITAL_COMMUNITY)
Admission: RE | Admit: 2018-06-09 | Discharge: 2018-06-09 | Disposition: A | Discharge status: Home / Self Care | Payer: Medicare HMO | Source: Ambulatory Visit | Attending: Oncology | Admitting: Oncology

## 2018-06-09 DIAGNOSIS — C61 Malignant neoplasm of prostate: Secondary | ICD-10-CM | POA: Diagnosis present

## 2018-06-09 MED ORDER — SODIUM CHLORIDE (PF) 0.9 % IJ SOLN
INTRAMUSCULAR | Status: AC
  Filled 2018-06-09: qty 50

## 2018-06-09 MED ORDER — IOHEXOL 300 MG/ML  SOLN
100.0000 mL | Freq: Once | INTRAMUSCULAR | Status: AC | PRN
  Administered 2018-06-09: 100 mL via INTRAVENOUS

## 2018-06-12 ENCOUNTER — Other Ambulatory Visit: Payer: Self-pay | Admitting: Oncology

## 2018-06-18 MED FILL — ZYTIGA 250 MG TABLET: 250 | 30 days supply | Qty: 120 | Fill #0

## 2018-07-03 ENCOUNTER — Telehealth: Payer: Self-pay

## 2018-07-03 NOTE — Telephone Encounter (Signed)
Oral Oncology Patient Advocate Encounter  I was successful at securing a grant with [employer] for $7,500. This will keep the out of pocket expense for Zytiga at $0. The grant information is as follows and has been shared with [name].  Approval dates: 07/02/18-07/03/19 ID: [id number] Group: [id number] BIN: [id number] PCN: [id number]  I called the patient to give him the good news and had to leave a voicemail.   James [name] Phone [phone] Fax [phone]

## 2018-07-15 ENCOUNTER — Other Ambulatory Visit: Payer: Self-pay | Admitting: Oncology

## 2018-07-20 MED FILL — ZYTIGA 250 MG TABLET: 250 | 30 days supply | Qty: 120 | Fill #0

## 2018-08-12 ENCOUNTER — Other Ambulatory Visit: Payer: Self-pay | Admitting: Oncology

## 2018-08-19 ENCOUNTER — Telehealth: Payer: Self-pay | Admitting: Pharmacist

## 2018-08-19 DIAGNOSIS — C61 Malignant neoplasm of prostate: Secondary | ICD-10-CM

## 2018-08-19 MED ORDER — ABIRATERONE ACETATE 250 MG PO TABS: 1000.0000 mg | ORAL_TABLET | Freq: Every day | ORAL | 1 refills | Status: DC

## 2018-08-19 MED FILL — ABIRATERONE ACETATE 250 MG: 250 | 30 days supply | Qty: 120 | Fill #0

## 2018-08-19 NOTE — Telephone Encounter (Signed)
Oral Chemotherapy Pharmacist Encounter  Follow-Up Form  Spoke with patient today to follow up regarding patient's oral chemotherapy medication: Zytiga (abiraterone) in conjunction with prednisone, for the treatment of metastatic, castration-sensitive prostate cancer, planned duration until disease progression or unacceptable toxicity.  Original Start date of oral chemotherapy: 02/21/16  Pt is doing well today  Pt reports 0 tablets/doses of Zytiga 250mg  tablets, 4 tablets (1000mg ) by mouth once daily on an empty stomach, 1 hour before or 2 hours after food missed in the last month.  Patient states he takes his Zytiga at 5am daily and prednisone 5mg  tablet at 6pm daily with breakfast.  Pt reports the following side effects: mild fatigue, manageable and unchanged  Pertinent labs reviewed: OK for continued treatment.  Other Issues: Patient's prescription insurance coverage [facility] Medicare part D) is now mandating that patient try and failed generic abiraterone prior to paying for brand name Zytiga.  Discussed this with patient.  He has only had the brand name product.  I will send a new prescription for generic abiraterone to the pharmacy as patient already has plan to pick up his next fill of medication today.  He will alert the office if he has any issues with this product change.  Patient knows to call the office with questions or concerns. Oral Oncology Clinic will continue to follow.  Johny Drilling, PharmD, BCPS, BCOP  08/19/2018 9:33 AM Oral Oncology Clinic [phone]

## 2018-09-22 MED FILL — ABIRATERONE ACETATE 250 MG: 250 | 30 days supply | Qty: 120 | Fill #1

## 2018-10-02 ENCOUNTER — Inpatient Hospital Stay: Payer: Medicare HMO | Attending: Oncology | Admitting: Oncology

## 2018-10-02 ENCOUNTER — Inpatient Hospital Stay: Payer: Medicare HMO

## 2018-10-02 DIAGNOSIS — C61 Malignant neoplasm of prostate: Secondary | ICD-10-CM | POA: Diagnosis not present

## 2018-10-02 NOTE — Progress Notes (Signed)
Virtual Visit via Telephone Note  I connected with James [name] on 10/02/18 at  1:15 PM EDT by telephone and verified that I am speaking with the correct person using two identifiers.   I discussed the limitations, risks, security and privacy concerns of performing an evaluation and management service by telephone and the availability of in person appointments. I also discussed with the patient that there may be a patient responsible charge related to this service. The patient expressed understanding and agreed to proceed.   History of Present Illness:  73 year old man with castration-sensitive prostate cancer diagnosed in 2017.  He presented with localized disease in 2013 with a Gleason score 3+4 = 7.   He is currently on androgen deprivation therapy with Lupron as well as Zytiga which he has tolerated very well.  His PSA remains undetectable.    Observations/Objective: He continues to feel well without any recent issues or complications.  He has tolerated Zytiga without any new side effects.  He denies excessive fatigue, tiredness or lower extremity edema.  He denies any recent hospitalizations or illnesses.  Assessment and Plan:  Risks and benefits of continuing Zytiga was discussed today and is agreeable to continue.  The plan is to continue the same dose and schedule and repeat laboratory testing in May 2020.  He will receive Lupron at that time and will be repeated in 4 months.  Follow Up Instructions:  In 6 weeks for lab and injection.  MD follow-up 4 months after that.   I discussed the assessment and treatment plan with the patient. The patient was provided an opportunity to ask questions and all were answered. The patient agreed with the plan and demonstrated an understanding of the instructions.   The patient was advised to call back or seek an in-person evaluation if the symptoms worsen or if the condition fails to improve as anticipated.  I provided 20 minutes of  non-face-to-face time during this encounter.   Zola Button, MD

## 2018-10-05 ENCOUNTER — Telehealth: Payer: Self-pay | Admitting: Oncology

## 2018-10-05 ENCOUNTER — Other Ambulatory Visit: Payer: Self-pay | Admitting: Pharmacist

## 2018-10-05 NOTE — Telephone Encounter (Signed)
Scheduled appt per 4/3 sch message. °

## 2018-10-10 ENCOUNTER — Other Ambulatory Visit: Payer: Self-pay | Admitting: Oncology

## 2018-10-10 DIAGNOSIS — C61 Malignant neoplasm of prostate: Secondary | ICD-10-CM

## 2018-10-12 ENCOUNTER — Telehealth: Payer: Self-pay | Admitting: Oncology

## 2018-10-12 MED FILL — ABIRATERONE ACETATE 250 MG: 250 | 30 days supply | Qty: 120 | Fill #0

## 2018-10-12 NOTE — Telephone Encounter (Signed)
Returning patient's phone call, left patient a voicemail.

## 2018-11-13 ENCOUNTER — Inpatient Hospital Stay: Payer: Medicare HMO

## 2018-11-13 ENCOUNTER — Inpatient Hospital Stay: Payer: Medicare HMO | Attending: Oncology

## 2018-11-13 MED FILL — ABIRATERONE ACETATE 250 MG: 250 | 30 days supply | Qty: 120 | Fill #1

## 2018-12-07 ENCOUNTER — Other Ambulatory Visit: Payer: Self-pay | Admitting: Oncology

## 2018-12-07 DIAGNOSIS — C61 Malignant neoplasm of prostate: Secondary | ICD-10-CM

## 2018-12-14 MED FILL — ABIRATERONE ACETATE 250 MG: 250 | 30 days supply | Qty: 120 | Fill #0

## 2019-01-08 ENCOUNTER — Telehealth: Payer: Self-pay

## 2019-01-08 NOTE — Telephone Encounter (Addendum)
Oral Oncology Patient Advocate Encounter   Was successful in securing patient a $3500 grant from [employer] to provide copayment coverage for Zytiga.  This will keep the out of pocket expense at $0.     I called the patient and left a voicemail.    The billing information is as follows and has been shared with [place].   Member ID: [id number] Group ID: [id number] RxBin: [id number] PCN: [id number] Dates of Eligibility: 01/07/19 through 01/07/20  [place] [facility] [place] Phone [phone] Fax [phone] 01/08/2019    1:58 PM

## 2019-01-14 MED FILL — ABIRATERONE ACETATE 250 MG: 250 | 30 days supply | Qty: 120 | Fill #1

## 2019-02-10 ENCOUNTER — Other Ambulatory Visit: Payer: Self-pay | Admitting: Oncology

## 2019-02-10 DIAGNOSIS — C61 Malignant neoplasm of prostate: Secondary | ICD-10-CM

## 2019-02-15 MED FILL — ABIRATERONE ACETATE 250 MG: 250 | 30 days supply | Qty: 120 | Fill #0

## 2019-03-15 MED FILL — ABIRATERONE ACETATE 250 MG: 250 | 30 days supply | Qty: 120 | Fill #1

## 2019-03-16 ENCOUNTER — Inpatient Hospital Stay: Payer: Medicare HMO | Attending: Oncology

## 2019-03-16 ENCOUNTER — Other Ambulatory Visit: Payer: Self-pay

## 2019-03-16 ENCOUNTER — Inpatient Hospital Stay: Payer: Medicare HMO

## 2019-03-16 ENCOUNTER — Inpatient Hospital Stay (HOSPITAL_BASED_OUTPATIENT_CLINIC_OR_DEPARTMENT_OTHER): Payer: Medicare HMO | Admitting: Oncology

## 2019-03-16 VITALS — BP 120/74 | HR 61 | Temp 98.3°F | Resp 17 | Ht 70.0 in | Wt 184.2 lb

## 2019-03-16 DIAGNOSIS — I10 Essential (primary) hypertension: Secondary | ICD-10-CM | POA: Diagnosis not present

## 2019-03-16 DIAGNOSIS — Z79818 Long term (current) use of other agents affecting estrogen receptors and estrogen levels: Secondary | ICD-10-CM | POA: Insufficient documentation

## 2019-03-16 DIAGNOSIS — C61 Malignant neoplasm of prostate: Secondary | ICD-10-CM

## 2019-03-16 DIAGNOSIS — E876 Hypokalemia: Secondary | ICD-10-CM | POA: Insufficient documentation

## 2019-03-16 DIAGNOSIS — Z79899 Other long term (current) drug therapy: Secondary | ICD-10-CM | POA: Insufficient documentation

## 2019-03-16 DIAGNOSIS — Z923 Personal history of irradiation: Secondary | ICD-10-CM | POA: Diagnosis not present

## 2019-03-16 DIAGNOSIS — Z191 Hormone sensitive malignancy status: Secondary | ICD-10-CM | POA: Diagnosis not present

## 2019-03-16 LAB — CMP (CANCER CENTER ONLY)
ALT: 22 U/L (ref 0–44)
AST: 31 U/L (ref 15–41)
Albumin: 3.7 g/dL (ref 3.5–5.0)
Alkaline Phosphatase: 127 U/L — ABNORMAL HIGH (ref 38–126)
Anion gap: 6 (ref 5–15)
BUN: 23 mg/dL (ref 8–23)
CO2: 27 mmol/L (ref 22–32)
Calcium: 9.6 mg/dL (ref 8.9–10.3)
Chloride: 106 mmol/L (ref 98–111)
Creatinine: 1.27 mg/dL — ABNORMAL HIGH (ref 0.61–1.24)
GFR, Est AFR Am: >60 mL/min (ref >60)
GFR, Estimated: 56 mL/min — ABNORMAL LOW (ref >60)
Glucose, Bld: 89 mg/dL (ref 70–99)
Potassium: 4.1 mmol/L (ref 3.5–5.1)
Sodium: 139 mmol/L (ref 135–145)
Total Bilirubin: 0.3 mg/dL (ref 0.3–1.2)
Total Protein: 7.7 g/dL (ref 6.5–8.1)

## 2019-03-16 LAB — CBC WITH DIFFERENTIAL (CANCER CENTER ONLY)
Abs Immature Granulocytes: 0.01 10*3/uL (ref 0.00–0.07)
Basophils Absolute: 0.1 10*3/uL (ref 0.0–0.1)
Basophils Relative: 1 %
Eosinophils Absolute: 0.4 10*3/uL (ref 0.0–0.5)
Eosinophils Relative: 8 %
HCT: 38.2 % — ABNORMAL LOW (ref 39.0–52.0)
Hemoglobin: 12.8 g/dL — ABNORMAL LOW (ref 13.0–17.0)
Immature Granulocytes: 0 %
Lymphocytes Relative: 32 %
Lymphs Abs: 1.5 10*3/uL (ref 0.7–4.0)
MCH: 30.4 pg (ref 26.0–34.0)
MCHC: 33.5 g/dL (ref 30.0–36.0)
MCV: 90.7 fL (ref 80.0–100.0)
Monocytes Absolute: 0.5 10*3/uL (ref 0.1–1.0)
Monocytes Relative: 11 %
Neutro Abs: 2.3 10*3/uL (ref 1.7–7.7)
Neutrophils Relative %: 48 %
Platelet Count: 137 10*3/uL — ABNORMAL LOW (ref 150–400)
RBC: 4.21 MIL/uL — ABNORMAL LOW (ref 4.22–5.81)
RDW: 13.8 % (ref 11.5–15.5)
WBC Count: 4.7 10*3/uL (ref 4.0–10.5)
nRBC: 0 % (ref 0.0–0.2)

## 2019-03-16 MED ORDER — LEUPROLIDE ACETATE (4 MONTH) 30 MG IM KIT: 30.0000 mg | PACK | Freq: Once | INTRAMUSCULAR | Status: DC

## 2019-03-16 MED ORDER — LEUPROLIDE ACETATE (4 MONTH) 30 MG ~~LOC~~ KIT
30.0000 mg | PACK | Freq: Once | SUBCUTANEOUS | Status: AC
  Administered 2019-03-16: 13:00:00 30 mg via SUBCUTANEOUS
  Filled 2019-03-16: qty 30

## 2019-03-16 NOTE — Patient Instructions (Signed)
Leuprolide depot injection What is this medicine? LEUPROLIDE (loo PROE lide) is a man-made protein that acts like a natural hormone in the body. It decreases testosterone in men and decreases estrogen in women. In men, this medicine is used to treat advanced prostate cancer. In women, some forms of this medicine may be used to treat endometriosis, uterine fibroids, or other male hormone-related problems. This medicine may be used for other purposes; ask your health care provider or pharmacist if you have questions. COMMON BRAND NAME(S): Eligard, Lupron Depot, Lupron Depot-Ped, Viadur What should I tell my health care provider before I take this medicine? They need to know if you have any of these conditions: -diabetes -heart disease or previous heart attack -high blood pressure -high cholesterol -mental illness -osteoporosis -pain or difficulty passing urine -seizures -spinal cord metastasis -stroke -suicidal thoughts, plans, or attempt; a previous suicide attempt by you or a family member -tobacco smoker -unusual vaginal bleeding (women) -an unusual or allergic reaction to leuprolide, benzyl alcohol, other medicines, foods, dyes, or preservatives -pregnant or trying to get pregnant -breast-feeding How should I use this medicine? This medicine is for injection into a muscle or for injection under the skin. It is given by a health care professional in a hospital or clinic setting. The specific product will determine how it will be given to you. Make sure you understand which product you receive and how often you will receive it. Talk to your pediatrician regarding the use of this medicine in children. Special care may be needed. Overdosage: If you think you have taken too much of this medicine contact a poison control center or emergency room at once. NOTE: This medicine is only for you. Do not share this medicine with others. What if I miss a dose? It is important not to miss a dose.  Call your doctor or health care professional if you are unable to keep an appointment. Depot injections: Depot injections are given either once-monthly, every 12 weeks, every 16 weeks, or every 24 weeks depending on the product you are prescribed. The product you are prescribed will be based on if you are male or male, and your condition. Make sure you understand your product and dosing. What may interact with this medicine? Do not take this medicine with any of the following medications: -chasteberry This medicine may also interact with the following medications: -herbal or dietary supplements, like black cohosh or DHEA -male hormones, like estrogens or progestins and birth control pills, patches, rings, or injections -male hormones, like testosterone This list may not describe all possible interactions. Give your health care provider a list of all the medicines, herbs, non-prescription drugs, or dietary supplements you use. Also tell them if you smoke, drink alcohol, or use illegal drugs. Some items may interact with your medicine. What should I watch for while using this medicine? Visit your doctor or health care professional for regular checks on your progress. During the first weeks of treatment, your symptoms may get worse, but then will improve as you continue your treatment. You may get hot flashes, increased bone pain, increased difficulty passing urine, or an aggravation of nerve symptoms. Discuss these effects with your doctor or health care professional, some of them may improve with continued use of this medicine. Male patients may experience a menstrual cycle or spotting during the first months of therapy with this medicine. If this continues, contact your doctor or health care professional. What side effects may I notice from receiving this medicine? Side   effects that you should report to your doctor or health care professional as soon as possible: -allergic reactions like skin  rash, itching or hives, swelling of the face, lips, or tongue -breathing problems -chest pain -depression or memory disorders -pain in your legs or groin -pain at site where injected or implanted -seizures -severe headache -swelling of the feet and legs -suicidal thoughts or other mood changes -visual changes -vomiting Side effects that usually do not require medical attention (report to your doctor or health care professional if they continue or are bothersome): -breast swelling or tenderness -decrease in sex drive or performance -diarrhea -hot flashes -loss of appetite -muscle, joint, or bone pains -nausea -redness or irritation at site where injected or implanted -skin problems or acne This list may not describe all possible side effects. Call your doctor for medical advice about side effects. You may report side effects to FDA at 1-800-FDA-1088. Where should I keep my medicine? This drug is given in a hospital or clinic and will not be stored at home. NOTE: This sheet is a summary. It may not cover all possible information. If you have questions about this medicine, talk to your doctor, pharmacist, or health care provider.  2018 Elsevier/Gold Standard (2015-11-30 09:45:53)  

## 2019-03-16 NOTE — Progress Notes (Signed)
Hematology and Oncology Follow Up Visit  James [name] [id number] [birth year] 73 y.o. 03/16/2019 12:41 PM James [name], MDJones, [name], MD   Principle Diagnosis: 73 year old man with castration-sensitive prostate cancer with lymphadenopathy noted in 2017.  He was initially diagnosed in 2013 with a Gleason score 3+4 = 7.    Prior Therapy: He was treated with radiation therapy as a definitive modality well living in [place].  He developed a PSA recurrence in 2013 and his PSA was up to 2.9. He had a repeat biopsy at that time which showed a Gleason score 3+4 = 7. He was treated with cryoablation at that time.  In 2016 he had a elevated PSA up to 3.2 and a repeat biopsy in March 2016 showed a Gleason score 4+3 = 7 as well as a another foci of Gleason score 4+4 = 8. He underwent a repeat cryotherapy done while he was living in [place].  PSA went up to 2.96 and a PET scan obtained on 12/01/2015 showed marked hypermetabolic right pelvic sidewall lymph node consistent with metastatic disease. 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    Zytiga 1000 mg daily started in July 2017.  Interim History:  Mr. James [name] returns today for a repeat evaluation.  Since the last visit, he continues to do well without any complaints related to [facility].  He denies any nausea, fatigue or arthralgias.  Performance status and quality of life remain excellent.  He denies any bone pain or pathological fractures.  He remains ambulatory and attends activities of daily living.  Patient denied any alteration mental status, neuropathy, confusion or dizziness.  Denies any headaches or lethargy.  Denies any night sweats, weight loss or changes in appetite.  Denied orthopnea, dyspnea on exertion or chest discomfort.  Denies shortness of breath, difficulty breathing hemoptysis or cough.  Denies any  abdominal distention, nausea, early satiety or dyspepsia.  Denies any hematuria, frequency, dysuria or nocturia.  Denies any skin irritation, dryness or rash.  Denies any ecchymosis or petechiae.  Denies any lymphadenopathy or clotting.  Denies any heat or cold intolerance.  Denies any anxiety or depression.  Remaining review of system is negative.       Medications: Updated without any changes. Current Outpatient Medications  Medication Sig Dispense Refill  . abiraterone acetate (ZYTIGA) 250 MG tablet TAKE 4 TABLETS BY MOUTH DAILY. TAKE ON AN EMPTY STOMACH 1 HOUR BEFORE OR 2 HOURS AFTER A MEAL 120 tablet 1  . amLODipine (NORVASC) 10 MG tablet Take 10 mg by mouth daily.    Marland Kitchen atorvastatin (LIPITOR) 40 MG tablet     . predniSONE (DELTASONE) 5 MG tablet Take 1 tablet (5 mg total) by mouth daily with breakfast. 90 tablet 2   No current facility-administered medications for this visit.      Allergies: No Known Allergies  Past Medical History, Surgical history, Social history, and Family History unchanged on review.   Physical Exam: Blood pressure 120/74, pulse 61, temperature 98.3 F (36.8 C), temperature source Oral, resp. rate 17, height 5\' 10"  (1.778 m), weight 184 lb 3.2 oz (83.6 kg), SpO2 100 %.   ECOG: 0     General appearance: Alert, awake without any distress. Head: Atraumatic without abnormalities Oropharynx: Without any thrush or ulcers. Eyes: No scleral icterus. Lymph nodes: No lymphadenopathy noted in the cervical, supraclavicular, or axillary nodes Heart:regular  rate and rhythm, without any murmurs or gallops.   Lung: Clear to auscultation without any rhonchi, wheezes or dullness to percussion. Abdomin: Soft, nontender without any shifting dullness or ascites. Musculoskeletal: No clubbing or cyanosis. Neurological: No motor or sensory deficits. Skin: No rashes or lesions. Psychiatric: Mood and affect appeared normal.     Lab Results: Lab Results  Component  Value Date   WBC 4.7 03/16/2019   HGB 12.8 (L) 03/16/2019   HCT 38.2 (L) 03/16/2019   MCV 90.7 03/16/2019   PLT 137 (L) 03/16/2019     Chemistry      Component Value Date/Time   NA 139 03/16/2019 1202   NA 140 06/05/2017 1003   K 4.1 03/16/2019 1202   K 4.0 06/05/2017 1003   CL 106 03/16/2019 1202   CO2 27 03/16/2019 1202   CO2 27 06/05/2017 1003   BUN 23 03/16/2019 1202   BUN 25.2 06/05/2017 1003   CREATININE 1.27 (H) 03/16/2019 1202   CREATININE 1.3 06/05/2017 1003      Component Value Date/Time   CALCIUM 9.6 03/16/2019 1202   CALCIUM 9.9 06/05/2017 1003   ALKPHOS 127 (H) 03/16/2019 1202   ALKPHOS 78 06/05/2017 1003   AST 31 03/16/2019 1202   AST 35 (H) 06/05/2017 1003   ALT 22 03/16/2019 1202   ALT 63 (H) 06/05/2017 1003   BILITOT 0.3 03/16/2019 1202   BILITOT 0.37 06/05/2017 1003       Results for James [name], James [name] (MRN [id number]) as of 03/16/2019 12:42  Ref. Range 02/03/2018 11:22 06/02/2018 09:25  Prostate Specific Ag, Serum Latest Ref Range: 0.0 - 4.0 ng/mL <0.1 <0.1          Impression and Plan:  73 year old man with:  1.  Castration-sensitive prostate cancer with lymphadenopathy diagnosed in 2017.  He has tolerated Zytiga without any major complications at this time.  His PSA continues to be undetectable without any issues related to continuing this medication.  Risks and benefits and alternative options were reiterated.  Systemic chemotherapy, Trudi Ida among others will be deferred if he develops castration resistant disease.   2. Hypertension: No issues related to his blood pressure noted at this time.  This will continue to be monitored on Zytiga.  3. Hypokalemia: His potassium remains within normal range.  This will be monitored on Zytiga.  4. Androgen depravation therapy: He will receive Eligard today and be repeated every 4 months.  Long-term complications associated with this treatment include weight gain, hot flashes and osteoporosis.  5.  Goals  of care and prognosis: His therapy remains palliative although aggressive measures are warranted given his excellent performance status and limited disease.  6. Follow-up: In 4 months for repeat evaluation.  25  minutes was spent with the patient face-to-face today.  More than 50% of time was spent on updating his disease status, goals of care, treatment options and complications noted therapy.  Zola Button, MD 9/15/202012:41 PM

## 2019-03-17 ENCOUNTER — Telehealth: Payer: Self-pay

## 2019-03-17 ENCOUNTER — Telehealth: Payer: Self-pay | Admitting: Oncology

## 2019-03-17 LAB — PROSTATE-SPECIFIC AG, SERUM (LABCORP): Prostate Specific Ag, Serum: <0.1 ng/mL (ref 0.0–4.0)

## 2019-03-17 NOTE — Telephone Encounter (Signed)
Confirmed January 2021 appointment with patient.

## 2019-03-17 NOTE — Telephone Encounter (Signed)
Called patient to give lab results. No answer. Left message to return call to [phone].

## 2019-03-17 NOTE — Telephone Encounter (Signed)
-----   Message from Wyatt Portela, MD sent at 03/17/2019  8:16 AM EDT ----- Please let him know his PSA is still low.

## 2019-03-17 NOTE — Telephone Encounter (Signed)
Return phone call received from patient. Made patient aware that per Dr. Alen Blew, PSA remains low at less than 0.1. Patient verbalized understanding.

## 2019-04-09 ENCOUNTER — Other Ambulatory Visit: Payer: Self-pay | Admitting: Oncology

## 2019-04-09 DIAGNOSIS — C61 Malignant neoplasm of prostate: Secondary | ICD-10-CM

## 2019-04-15 MED FILL — ABIRATERONE ACETATE 250 MG: 250 | 30 days supply | Qty: 120 | Fill #0

## 2019-05-14 MED FILL — ABIRATERONE ACETATE 250 MG: 250 | 30 days supply | Qty: 120 | Fill #1

## 2019-05-27 IMAGING — CT CT ABD-PELV W/ CM
2 of 6 series · 14 of 46 positions shown, 16 images · IV contrast (omnipaque)
Comparison: PET-CT 12/01/2015.

CLINICAL DATA: Prostate cancer.

EXAM:
CT ABDOMEN AND PELVIS WITH CONTRAST
TECHNIQUE: Multidetector CT imaging of the abdomen and pelvis was performed
using the standard protocol following bolus administration of
intravenous contrast.
CONTRAST:  100mL OMNIPAQUE IOHEXOL 300 MG/ML  SOLN

[Series 2: axial st · axial · 0.79mm/px · z∈[-289,+101]mm · 11 of 94 slices shown, 13 images]
[im 8/94  soft-tissue]
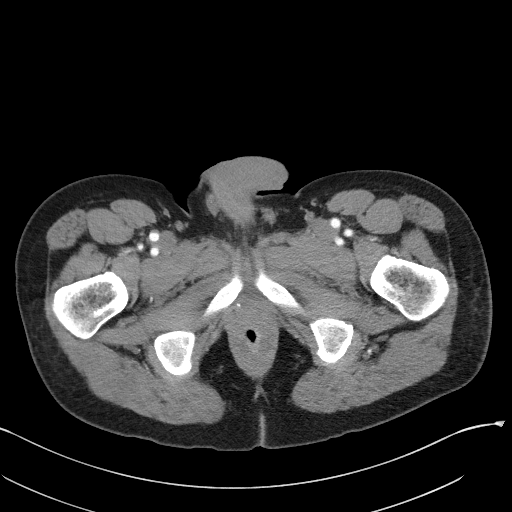
[im 8/94  bone]
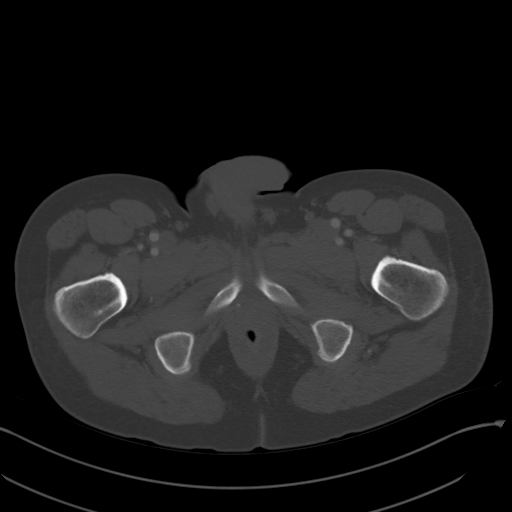
[im 16/94  soft-tissue]
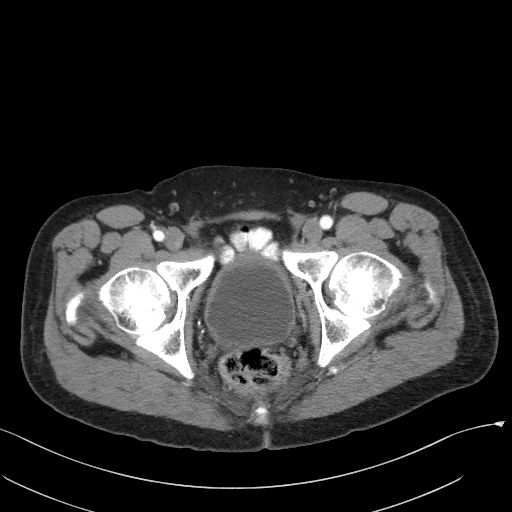
[im 24/94  soft-tissue]
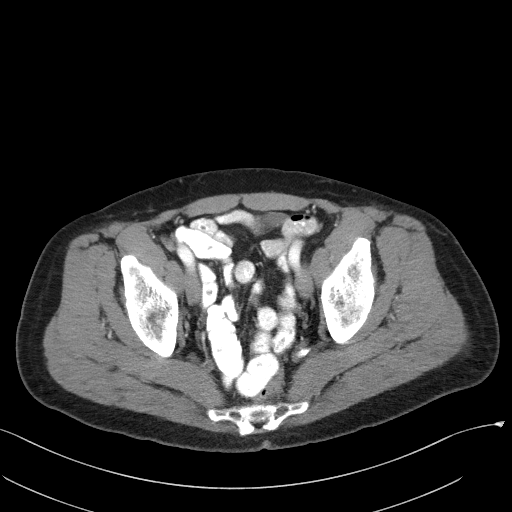
[im 32/94  soft-tissue]
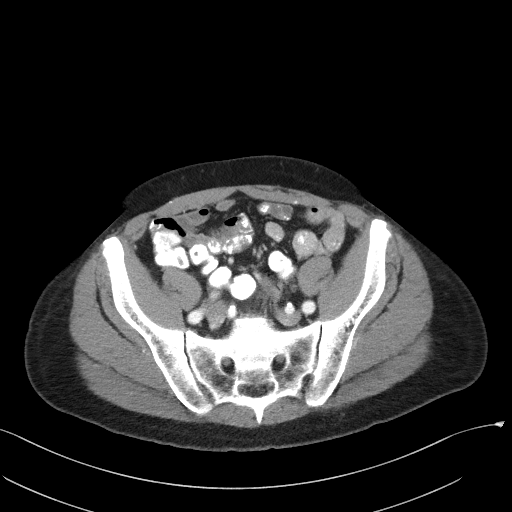
[im 39/94  soft-tissue]
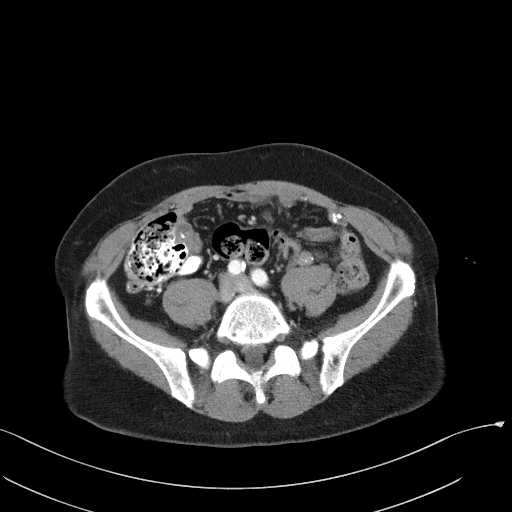
[im 47/94  soft-tissue]
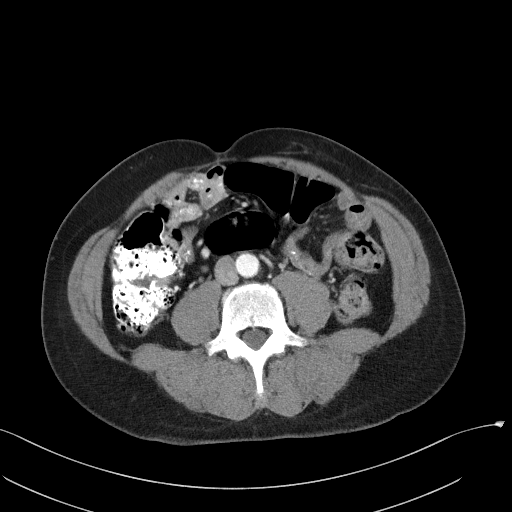
[im 55/94  soft-tissue]
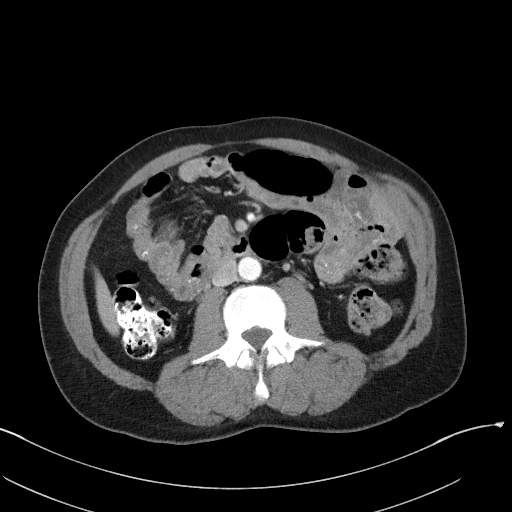
[im 63/94  soft-tissue]
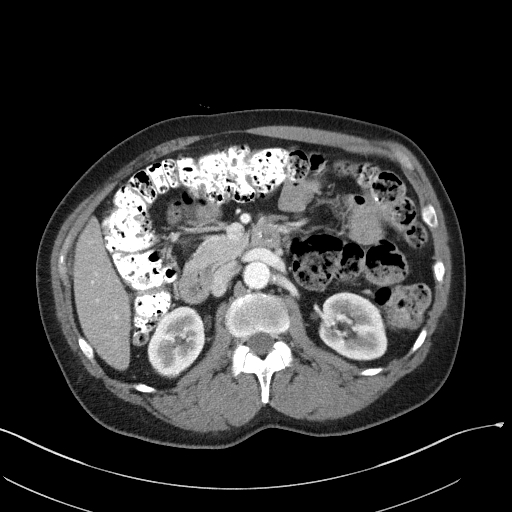
[im 70/94  soft-tissue]
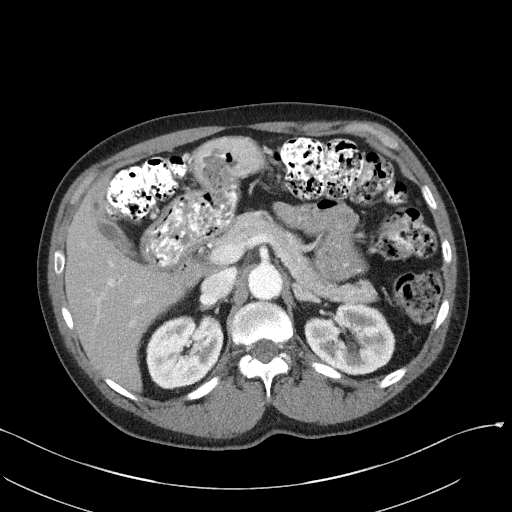
[im 70/94  bone]
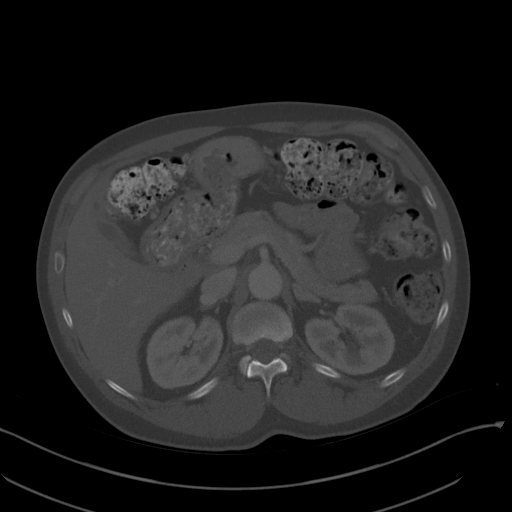
[im 78/94  soft-tissue]
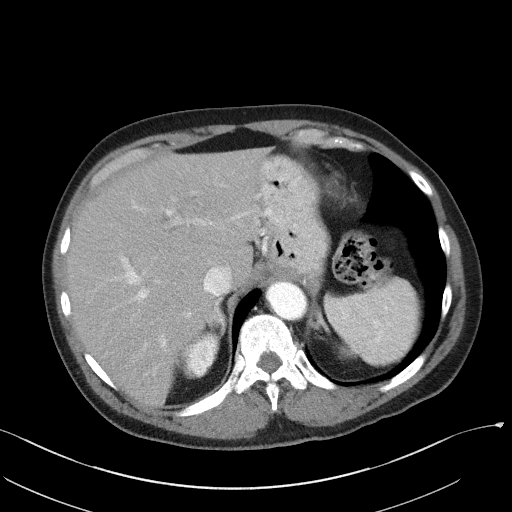
[im 86/94  soft-tissue]
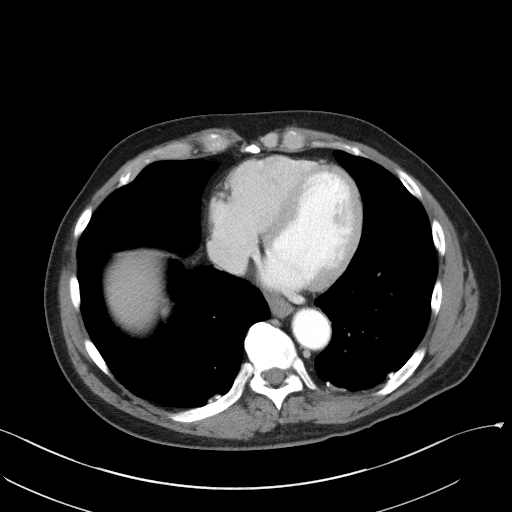

[Series 7: coronal st · coronal · 0.74mm/px · 3 of 92 slices shown]
[im 31/92  soft-tissue]
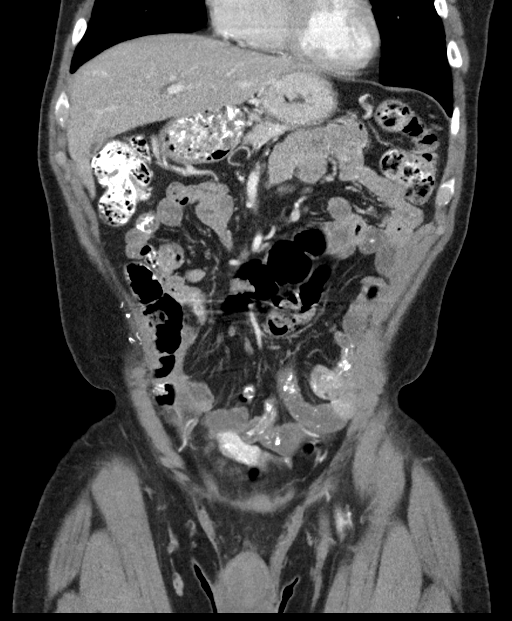
[im 41/92  soft-tissue]
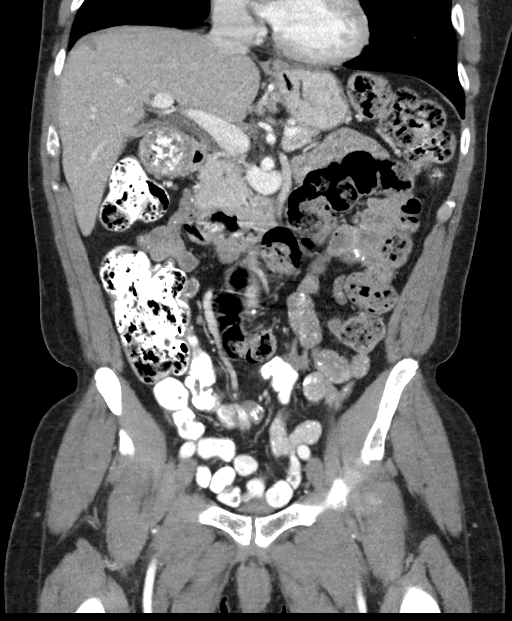
[im 51/92  soft-tissue]
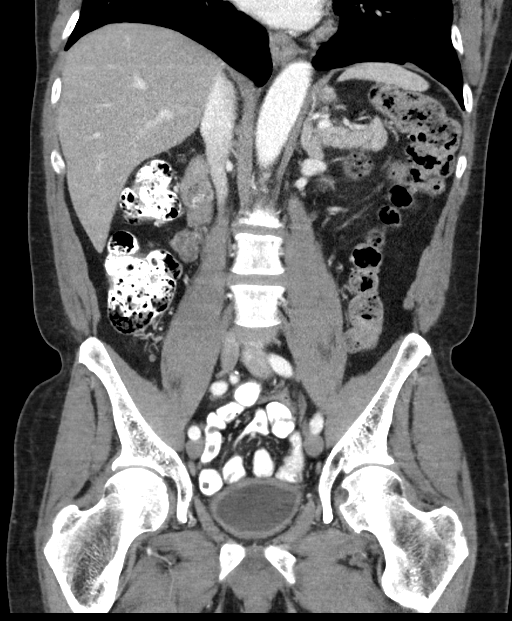

[14 of 46 positions shown; findings below may reference images not displayed]

FINDINGS: Lower chest: Coronary artery calcification is evident. Bilateral
calcified pleural plaques evident. Bilateral noncalcified pleural
nodules are similar to prior.

Hepatobiliary: 6 mm hypoattenuating lesion in the dome of the liver
([DATE]) is too small to characterize. Gallbladder is decompressed. No
intrahepatic or extrahepatic biliary dilation.

Pancreas: No focal mass lesion. No dilatation of the main duct. No
intraparenchymal cyst. No peripancreatic edema.

Spleen: No splenomegaly. No focal mass lesion.

Adrenals/Urinary Tract: No adrenal nodule or mass. Right kidney
unremarkable. 9 mm low-density lesion lower pole left kidney (37/2)
is compatible with a cyst. No evidence for hydroureter. The urinary
bladder appears normal for the degree of distention.

Stomach/Bowel: Mild circumferential wall thickening is noted in the
distal esophagus. There's fold thickening in the proximal stomach.
Duodenum is normally positioned as is the ligament of Treitz. No
small bowel wall thickening. No small bowel dilatation. The terminal
ileum is normal. The appendix is not visualized, but there is no
edema or inflammation in the region of the cecum. No gross colonic
mass. No colonic wall thickening.

Vascular/Lymphatic: There is abdominal aortic atherosclerosis
without aneurysm. There is no gastrohepatic or hepatoduodenal
ligament lymphadenopathy. No intraperitoneal or retroperitoneal
lymphadenopathy. No pelvic sidewall lymphadenopathy. 5 mm short axis
right pelvic sidewall lymph node on today's study (73/2) was 17 mm
previously. This is the lymph node that showed hypermetabolism on
that PET-CT.

Reproductive: Fiduciary markers noted in the prostate gland.

Other: No intraperitoneal free fluid.

Musculoskeletal: 6 mm sclerotic focus anterior right acetabulum is
unchanged in the interval. Otherwise no suspicious lytic or
sclerotic osseous abnormality.
IMPRESSION: 1. The right pelvic sidewall enlarged lymph nodes seen to be
hypermetabolic on the previous study has resolved in the interval.
No new lymphadenopathy in the abdomen or pelvis.
2. Similar appearance of bilateral calcified pleural plaques.
3. Stable 6 mm sclerotic focus anterior right acetabulum.
4. Persistent circumferential wall thickening in the distal
esophagus. Proximal gastric fold thickening is associated. Similar
changes were noted on the study from more than 2 years ago. Chronic
infection/inflammation would be a consideration.

## 2019-06-09 ENCOUNTER — Other Ambulatory Visit: Payer: Self-pay | Admitting: Oncology

## 2019-06-09 ENCOUNTER — Ambulatory Visit (INDEPENDENT_AMBULATORY_CARE_PROVIDER_SITE_OTHER): Payer: Medicare HMO | Admitting: Neurology

## 2019-06-09 ENCOUNTER — Encounter: Payer: Self-pay | Admitting: Neurology

## 2019-06-09 ENCOUNTER — Other Ambulatory Visit: Payer: Self-pay

## 2019-06-09 VITALS — BP 126/81 | HR 82 | Temp 98.4°F | Ht 70.0 in | Wt 188.0 lb

## 2019-06-09 DIAGNOSIS — R0683 Snoring: Secondary | ICD-10-CM

## 2019-06-09 DIAGNOSIS — C61 Malignant neoplasm of prostate: Secondary | ICD-10-CM

## 2019-06-09 DIAGNOSIS — G479 Sleep disorder, unspecified: Secondary | ICD-10-CM | POA: Diagnosis not present

## 2019-06-09 DIAGNOSIS — R519 Headache, unspecified: Secondary | ICD-10-CM

## 2019-06-09 NOTE — Patient Instructions (Signed)
I would recommend further diagnostic testing for your new onset headaches.  We will do blood work today.  We will also do a brain MRI with and without contrast, I would like to also rule out sleep apnea as a potential cause of your recurrent headaches.  We will seek insurance authorization for sleep study testing in your brain MRI and call you to schedule these.  We will call you with your blood test results soon. You can use Fioricet cautiously as needed, I would not recommend ongoing treatment with Fioricet, we can consider headache prevention with a daily preventative medication soon, for now, let us proceed with additional diagnostic testing first. Please schedule a eye exam as you Have not had an eye examination in about 2 years. Your neurological exam is reassuring today.

## 2019-06-09 NOTE — Progress Notes (Signed)
Subjective:    Patient ID: James [name] is a 73 y.o. male.  HPI     Star Age, MD, PhD [facility] [address], [address] [place], [place] [address]  Dear Dr. Ronnald Ramp,  I saw your patient, James [name], upon your kind request in my neurologic clinic today for initial consultation of his recurrent headaches.  The patient is unaccompanied today.  As you know, James [name] is a 73 year old right-handed gentleman with an underlying medical history of hypertension, hyperlipidemia, vitamin D deficiency, prostate cancer, history of syphilis, chronic pain, prior smoking, and overweight state, who reports recent headaches, for the past 1 month.  He has had mostly left-sided headaches.  He had symptomatic treatment with Fioricet, I reviewed your office note from 06/03/2019.  You ordered a head CT without contrast.  He he had a head CT without contrast on 06/04/2019 and I reviewed the results: Impression: Age-related changes.  Findings consistent with small vessel atherosclerotic disease.  No acute abnormality.   He reports a dull, achy sensation, a constant pressure-like headache in the top of the head and sometimes in the left parietal and parietotemporal area, no tenderness to touch in the temple area.  No obvious triggers.  He does report that he does not sleep very well.  He lives alone, is divorced, has had a variable sleep pattern, sometimes goes to bed around 9 or 10 and sometimes much later.  He reports that he is on the Internet a lot.  His rise time is variable but he sets an alarm for 530 to take his prostate medication because he has to take it an hour before eating, sometimes he goes back to sleep for a little bit.  He has nocturia about 3 times per average night.  He denies any recurrent nocturnal or morning headaches.  He has an Epworth sleepiness score of 5 out of 24.  He snores a little he believes.  He does not have any pets in the household and does not have a  TV in the bedroom.  He has not had any sudden onset of one-sided weakness or numbness or tingling or droopy face or slurring of speech.  He has no history of migraine headaches, headaches currently are not throbbing, no nausea or vomiting and no photophobia associated.  He has not had an eye examination in over 2 years.  He had prescription eyeglasses but does not use them.  He does not drink daily caffeine.  He does not drink daily alcohol.  He does not smoke. He tested positive for COVID-19 IgG antibodies.  He believes that he had an acute illness when he was traveling through [place] in January 2020 and he may have had an infection then.  His Past Medical History Is Significant For: Past Medical History:  Diagnosis Date  . GERD (gastroesophageal reflux disease)   . Hyperlipidemia   . Hypertension   . Nocturia   . 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 Wears glasses     His Past Surgical History Is Significant For: Past Surgical History:  Procedure Laterality Date  . COLONOSCOPY  last one 2014  . CRYOABLATION N/A 12/05/2014   Procedure: CRYO ABLATION PROSTATE;  Surgeon: Carolan Clines, MD;  Location: [facility];  Service: Urology;  Laterality: N/A;  . PROSTATE CRYOABLATION  March 2015  [facility])  . REPAIR  RIGHT RING AND LITTLE FINGERS  2014    post numbness residual    His Family History Is Significant For: No family history on file.  His Social History Is Significant For: Social History   Socioeconomic History  . Marital status: Single    Spouse name: Not on file  . Number of children: Not on file  . Years of education: Not on file  . Highest education level: Not on file  Occupational History  . Not on file  Social Needs  . Financial resource strain: Not on file  . Food insecurity    Worry: Not on file    Inability: Not on file  .  Transportation needs    Medical: Not on file    Non-medical: Not on file  Tobacco Use  . Smoking status: Former Smoker    Packs/day: 1.00    Years: 40.00    Pack years: 40.00    Types: Cigarettes    Quit date: 11/29/2012    Years since quitting: 6.5  . Smokeless tobacco: Never Used  Substance and Sexual Activity  . Alcohol use: No  . Drug use: No  . Sexual activity: Not on file  Lifestyle  . Physical activity    Days per week: Not on file    Minutes per session: Not on file  . Stress: Not on file  Relationships  . Social Herbalist on phone: Not on file    Gets together: Not on file    Attends religious service: Not on file    Active member of club or organization: Not on file    Attends meetings of clubs or organizations: Not on file    Relationship status: Not on file  Other Topics Concern  . Not on file  Social History Narrative  . Not on file    His Allergies Are:  No Known Allergies:   His Current Medications Are:  Outpatient Encounter Medications as of 06/09/2019  Medication Sig  . abiraterone acetate (ZYTIGA) 250 MG tablet TAKE 4 TABLETS BY MOUTH DAILY. TAKE ON AN EMPTY STOMACH 1 HOUR BEFORE OR 2 HOURS AFTER A MEAL  . amLODipine (NORVASC) 10 MG tablet Take 10 mg by mouth daily.  Marland Kitchen atorvastatin (LIPITOR) 40 MG tablet   . butalbital-acetaminophen-caffeine (FIORICET) 50-325-40 MG tablet Take by mouth 2 (two) times daily as needed for headache.  . [DISCONTINUED] predniSONE (DELTASONE) 5 MG tablet Take 1 tablet (5 mg total) by mouth daily with breakfast.   No facility-administered encounter medications on file as of 06/09/2019.   :   Review of Systems:  Out of a complete 14 point review of systems, all are reviewed and negative with the exception of these symptoms as listed below:  Review of Systems  Neurological:       Pt presents today to discuss his daily headache. Pt has tried fioricet and it helps some. Pt had recent imaging studies. Pt has never  had a sleep study but thinks that he does snore.  Epworth Sleepiness Scale 0= would never doze 1= slight chance of dozing 2= moderate chance of dozing 3= high chance of dozing  Sitting and reading: 1 Watching TV: 1 Sitting inactive in a public place (ex. Theater or meeting): 1 As a passenger in a car for an hour without a break: 0 Lying down to rest in the afternoon: 2 Sitting and talking to someone: 0 Sitting quietly after lunch (no alcohol):0 In a car, while stopped in traffic:  0 Total: 5     Objective:  Neurological Exam  Physical Exam  Physical Examination:   Vitals:   06/09/19 1124  BP: 126/81  Pulse: 82  Temp: 98.4 F (36.9 C)    General Examination: The patient is a very pleasant 73 y.o. male in no acute distress. He appears well-developed and well-nourished and well groomed.   HEENT: Normocephalic, atraumatic, pupils are equal, round and reactive to light and accommodation. Funduscopic exam is Difficult due to bilateral cataracts noted.  He is wearing nonprescription eyeglasses.  Hearing is mildly paired. Extraocular tracking is good without limitation to gaze excursion or nystagmus noted. Normal smooth pursuit is noted. Face is symmetric with normal facial animation and normal facial sensation. Speech is clear with no dysarthria noted. There is no hypophonia. There is no lip, neck/head, jaw or voice tremor. Neck is supple with full range of passive and active motion. There are no carotid bruits on auscultation. Oropharynx exam reveals: mild mouth dryness, Nearly edentulous state, with only a few teeth on the bottom, mild airway crowding secondary to smaller airway entry, redundant soft palate and tonsillar size of 1+.  Tongue protrudes centrally and palate elevates symmetrically.  Chest: Clear to auscultation without wheezing, rhonchi or crackles noted.  Heart: S1+S2+0, regular and normal without murmurs, rubs or gallops noted.   Abdomen: Soft, non-tender and  non-distended with normal bowel sounds appreciated on auscultation.  Extremities: There is no pitting edema in the distal lower extremities bilaterally. Pedal pulses are intact.  Skin: Warm and dry without trophic changes noted. There are no varicose veins.  Musculoskeletal: exam reveals no obvious joint deformities, tenderness or joint swelling or erythema.   Neurologically:  Mental status: The patient is awake, alert and oriented in all 4 spheres. His immediate and remote memory, attention, language skills and fund of knowledge are appropriate. There is no evidence of aphasia, agnosia, apraxia or anomia. Speech is clear with normal prosody and enunciation. Thought process is linear. Mood is normal and affect is normal.  Cranial nerves II - XII are as described above under HEENT exam. In addition: shoulder shrug is normal with equal shoulder height noted. Motor exam: Normal bulk, strength and tone is noted. There is no drift, tremor or rebound. Romberg is negative. Reflexes are 1+ throughout. Babinski: Toes are flexor bilaterally. Fine motor skills and coordination: intact with normal finger taps, normal hand movements, normal rapid alternating patting, normal foot taps and normal foot agility.  Cerebellar testing: No dysmetria or intention tremor on finger to nose testing. Heel to shin is unremarkable bilaterally. There is no truncal or gait ataxia.  Sensory exam: intact to light touch, vibration, temperature sense in the upper and lower extremities.  Gait, station and balance: He stands easily. No veering to one side is noted. No leaning to one side is noted. Posture is age-appropriate and stance is narrow based. Gait shows normal stride length and normal pace. No problems turning are noted. Tandem walk is unremarkable. Intact toe and heel stance is noted.               Assessment and Plan:   In summary, James [name] is a very pleasant 73 y.o.-year old male with an underlying medical history of  hypertension, hyperlipidemia, vitamin D deficiency, prostate cancer, history of syphilis, chronic pain, prior smoking, and overweight state, who Presents for evaluation of his new headaches for the past 3 to 4 weeks.  He describes a constant achy and more dull headache  in the top of the head and sometimes on the left side, no associated neurological accompaniments.  Nevertheless, for new onset headache without prior history of migraines or recurrent headaches in the past, I would like to proceed with additional diagnostic testing to rule out an organic or structural cause of his symptoms.  To that end, I would like to proceed with a brain MRI with and without contrast.  I would like To proceed with a sleep study to rule out underlying obstructive sleep disordered breathing.  He does not sleep very well.  He has significant nocturia.  His neurological exam is nonfocal.  He would benefit from a proper eye examination and he is encouraged to make an appointment with his eye doctor.In addition, we will do blood work to look at his kidney and liver function.  He reports that he used to see a kidney specialist. If possible, I would like to do the brain MRI with and without contrast especially in light of his Prostate cancer diagnosis.  We will do inflammatory and autoimmune markers as well and call him with the test results.  We will consider sleep apnea treatment if he qualifies.  He can continue with that as needed and cautious use of Fioricet, we may initiate headache preventative treatment if need be in the near future.  For now, he is agreeable to proceeding with additional diagnostic testing first. I plan to see him back soon after testing.  I answered all his questions today and he was in agreement. Thank you very much for allowing me to participate in the care of this nice patient. If I can be of any further assistance to you please do not hesitate to call me at [phone].  Sincerely,   Star Age, MD,  PhD

## 2019-06-10 ENCOUNTER — Telehealth: Payer: Self-pay

## 2019-06-10 LAB — COMPREHENSIVE METABOLIC PANEL
ALT: 30 IU/L (ref 0–44)
AST: 36 IU/L (ref 0–40)
Albumin/Globulin Ratio: 1.3 (ref 1.2–2.2)
Albumin: 4.1 g/dL (ref 3.7–4.7)
Alkaline Phosphatase: 134 IU/L — ABNORMAL HIGH (ref 39–117)
BUN/Creatinine Ratio: 21 (ref 10–24)
BUN: 22 mg/dL (ref 8–27)
Bilirubin Total: <0.2 mg/dL (ref 0.0–1.2)
CO2: 22 mmol/L (ref 20–29)
Calcium: 9.8 mg/dL (ref 8.6–10.2)
Chloride: 104 mmol/L (ref 96–106)
Creatinine, Ser: 1.06 mg/dL (ref 0.76–1.27)
GFR calc Af Amer: 81 mL/min/{1.73_m2} (ref >59)
GFR calc non Af Amer: 70 mL/min/{1.73_m2} (ref >59)
Globulin, Total: 3.1 g/dL (ref 1.5–4.5)
Glucose: 79 mg/dL (ref 65–99)
Potassium: 4.2 mmol/L (ref 3.5–5.2)
Sodium: 138 mmol/L (ref 134–144)
Total Protein: 7.2 g/dL (ref 6.0–8.5)

## 2019-06-10 LAB — C-REACTIVE PROTEIN: CRP: <1 mg/L (ref 0–10)

## 2019-06-10 LAB — ANA W/REFLEX: Anti Nuclear Antibody (ANA): NEGATIVE

## 2019-06-10 LAB — RHEUMATOID FACTOR: Rheumatoid fact SerPl-aCnc: <10 IU/mL (ref 0.0–13.9)

## 2019-06-10 LAB — SEDIMENTATION RATE: Sed Rate: 25 mm/hr (ref 0–30)

## 2019-06-10 NOTE — Progress Notes (Signed)
Labs are unremarkable with the exception of one of his liver enzymes called alkaline phosphatase.  It has been elevated before, it is not seriously elevated but would be worth Rechecking with his blood work in the future when he sees his primary care physician. Please update patient.

## 2019-06-10 NOTE — Telephone Encounter (Signed)
-----   Message from Star Age, MD sent at 06/10/2019  3:47 PM EST ----- Labs are unremarkable with the exception of one of his liver enzymes called alkaline phosphatase.  It has been elevated before, it is not seriously elevated but would be worth Rechecking with his blood work in the future when he sees his primary care physician. Please update patient.

## 2019-06-10 NOTE — Telephone Encounter (Signed)
I called pt and discussed his lab results and recommendations. Pt verbalized understanding of results. Pt had no questions at this time but was encouraged to call back if questions arise.

## 2019-06-11 MED FILL — ABIRATERONE ACETATE 250 MG: 250 | 30 days supply | Qty: 120 | Fill #0

## 2019-06-21 ENCOUNTER — Ambulatory Visit (INDEPENDENT_AMBULATORY_CARE_PROVIDER_SITE_OTHER): Payer: Medicare HMO | Admitting: Neurology

## 2019-06-21 ENCOUNTER — Other Ambulatory Visit: Payer: Self-pay

## 2019-06-21 DIAGNOSIS — R519 Headache, unspecified: Secondary | ICD-10-CM

## 2019-06-21 DIAGNOSIS — G4733 Obstructive sleep apnea (adult) (pediatric): Secondary | ICD-10-CM | POA: Diagnosis not present

## 2019-06-21 DIAGNOSIS — G479 Sleep disorder, unspecified: Secondary | ICD-10-CM

## 2019-06-21 DIAGNOSIS — C61 Malignant neoplasm of prostate: Secondary | ICD-10-CM

## 2019-06-21 DIAGNOSIS — R0683 Snoring: Secondary | ICD-10-CM

## 2019-06-30 NOTE — Procedures (Signed)
Patient Information     First Name: James Last Name: [name] ID: [id number]  Birth Date: 1945-08-17 Age: 73 Gender: Male  Referring Provider: [name], MD BMI: 26.8 (W=187 lb, H=5' 10'')  Neck Circ.:  26 '' Epworth:  5/24   Sleep Study Information    Study Date: Jun 22, 2019 S/H/A Version: 001.001.001.001 / 4.1.1528 / 5  History:    73 year old man with a history of hypertension, hyperlipidemia, vitamin D deficiency, prostate cancer, history of syphilis, chronic pain, prior smoking, and overweight state, who reports recent onset of headaches. He reports that he does not sleep very well.   Summary & Diagnosis:     OSA Recommendations:     This home sleep test demonstrates moderate obstructive sleep apnea with a total AHI of 28.8/hour and O2 nadir of 89%. Treatment with positive airway pressure (in the form of CPAP) is recommended. This will require a full night CPAP titration study for proper treatment settings, O2 monitoring and mask fitting. Based on the severity of the sleep disordered breathing an attended titration study is indicated. However, patient's insurance has denied an attended sleep study; therefore, the patient will be advised to proceed with an autoPAP titration/trial at home for now. Please note that untreated obstructive sleep apnea may carry additional perioperative morbidity. Patients with significant obstructive sleep apnea should receive perioperative PAP therapy and the surgeons and particularly the anesthesiologist should be informed of the diagnosis and the severity of the sleep disordered breathing. The patient should be cautioned not to drive, work at heights, or operate dangerous or heavy equipment when tired or sleepy. Review and reiteration of good sleep hygiene measures should be pursued with any patient. Other causes of the patient's symptoms, including circadian rhythm disturbances, an underlying mood disorder, medication effect and/or an underlying medical problem  cannot be ruled out based on this test. Clinical correlation is recommended. The patient and his referring provider will be notified of the test results. The patient will be seen in follow up in sleep clinic at [facility].  I certify that I have reviewed the raw data recording prior to the issuance of this report in accordance with the standards of the American Academy of Sleep Medicine (AASM).  Star Age, MD, PhD [facility]) Diplomat, ABPN (Neurology and Sleep)              Sleep Summary    Oxygen Saturation Statistics     Start Study Time: End Study Time: Total Recording Time:  1:12:17 AM 8:23:55 AM 7 h, 11 min  Total Sleep Time % REM of Sleep Time:  6 h, 19 min  17.8    Mean: 94 Minimum: 89 Maximum: 99  Mean of Desaturations Nadirs (%):   92  Oxygen Desaturation. %: 4-9 10-20 >20 Total  Events Number Total  55 100.0  0 0.0  0 0.0  55 100.0  Oxygen Saturation: <90 <=88 <85 <80 <70  Duration (minutes): Sleep % 0.2 0.0 0.0 0.0 0.0 0.0 0.0 0.0 0.0 0.0     Respiratory Indices      Total Events REM NREM All Night  pRDI:  185  pAHI:  176 ODI:  55  pAHIc:  5  % CSR: 0.0 53.6 52.6 26.8 3.7 25.3 23.7 5.2 0.2 30.3 28.8 9.0 0.8       Pulse Rate Statistics during Sleep (BPM)      Mean: 60 Minimum: 47  Maximum: 99    Indices are  calculated using technically valid sleep time of  6 hrs, 6 min. pRDI/pAHI are calculated using oxi desaturations ? 3%  Body Position Statistics  Position Supine Prone Right Left Non-Supine  Sleep (min) 377.1 1.5 0.0 0.5 2.0  Sleep % 99.5 0.4 0.0 0.1 0.5  pRDI 30.1 N/A N/A N/A N/A  pAHI 28.7 N/A N/A N/A N/A  ODI 9.0 N/A N/A N/A N/A     Snoring Statistics Snoring Level (dB) >40 >50 >60 >70 >80 >Threshold (45)  Sleep (min) 223.0 54.5 17.7 0.0 0.0 124.8  Sleep % 58.8 14.4 4.7 0.0 0.0 32.9    Mean: 45 dB Sleep Stages Chart                                            pAHI=28.8                                                         Mild              Moderate                    Severe                                                 5              15                    30

## 2019-06-30 NOTE — Progress Notes (Signed)
Patient referred by Dr. Ronnald Ramp, seen by me on 06/09/19 for new onset HAs, HST on 06/22/19.   Please call and notify the patient that the recent home sleep test showed obstructive sleep apnea in the moderate range. While I recommend treatment for this in the form CPAP, his insurance will not approve a sleep study for this. They will likely only approve a trial of autoPAP, which means, that we don't have to bring him in for a sleep study with CPAP, but will let him start using a so called autoPAP machine at home, through a DME company (of his choice, or as per insurance requirement). The DME representative will educate him on how to use the machine, how to put the mask on, etc. I have placed an order in the chart. Please send referral, talk to patient, send report to referring MD. We will need a FU in sleep clinic for 10 weeks post-PAP set up, please arrange that with me or one of our NPs. Thanks,   Star Age, MD, PhD [facility])

## 2019-06-30 NOTE — Addendum Note (Signed)
Addended by: Star Age on: 06/30/2019 11:30 AM   Modules accepted: Orders

## 2019-07-01 ENCOUNTER — Telehealth: Payer: Self-pay | Admitting: Neurology

## 2019-07-01 NOTE — Telephone Encounter (Signed)
Called patient to discuss sleep study results. No answer at this time. LVM for the patient to call back.  LVM informing our office will reopen on Monday.

## 2019-07-01 NOTE — Telephone Encounter (Signed)
-----   Message from Star Age, MD sent at 06/30/2019 11:30 AM EST ----- Patient referred by Dr. Ronnald Ramp, seen by me on 06/09/19 for new onset HAs, HST on 06/22/19.   Please call and notify the patient that the recent home sleep test showed obstructive sleep apnea in the moderate range. While I recommend treatment for this in the form CPAP, his insurance will not approve a sleep study for this. They will likely only approve a trial of autoPAP, which means, that we don't have to bring him in for a sleep study with CPAP, but will let him start using a so called autoPAP machine at home, through a DME company (of his choice, or as per insurance requirement). The DME representative will educate him on how to use the machine, how to put the mask on, etc. I have placed an order in the chart. Please send referral, talk to patient, send report to referring MD. We will need a FU in sleep clinic for 10 weeks post-PAP set up, please arrange that with me or one of our NPs. Thanks,   Star Age, MD, PhD [facility])

## 2019-07-07 ENCOUNTER — Ambulatory Visit
Admission: RE | Admit: 2019-07-07 | Discharge: 2019-07-07 | Disposition: A | Discharge status: Home / Self Care | Payer: Medicare HMO | Source: Ambulatory Visit | Attending: Neurology | Admitting: Neurology

## 2019-07-07 ENCOUNTER — Other Ambulatory Visit: Payer: Self-pay

## 2019-07-07 ENCOUNTER — Telehealth: Payer: Self-pay | Admitting: Pharmacy Technician

## 2019-07-07 DIAGNOSIS — R519 Headache, unspecified: Secondary | ICD-10-CM

## 2019-07-07 DIAGNOSIS — C61 Malignant neoplasm of prostate: Secondary | ICD-10-CM

## 2019-07-07 DIAGNOSIS — R0683 Snoring: Secondary | ICD-10-CM

## 2019-07-07 DIAGNOSIS — G479 Sleep disorder, unspecified: Secondary | ICD-10-CM

## 2019-07-07 MED ORDER — GADOBENATE DIMEGLUMINE 529 MG/ML IV SOLN
17.0000 mL | Freq: Once | INTRAVENOUS | Status: AC | PRN
  Administered 2019-07-07: 17 mL via INTRAVENOUS

## 2019-07-07 NOTE — Telephone Encounter (Signed)
I called pt. I advised pt that Dr. Rexene Alberts reviewed their sleep study results and found that pt has moderate sleep apnea. Dr. Rexene Alberts recommends that pt starts auto CPAP. I reviewed PAP compliance expectations with the pt. Pt is agreeable to starting a CPAP. I advised pt that an order will be sent to a DME, [facility], and [facility] will call the pt within about one week after they file with the pt's insurance. [facility] will show the pt how to use the machine, fit for masks, and troubleshoot the CPAP if needed. A follow up appt was made for insurance purposes with Dr. Rexene Alberts on MArch 15,2021 at 3 pm. Pt verbalized understanding to arrive 15 minutes early and bring their CPAP. A letter with all of this information in it will be mailed to the pt as a reminder. I verified with the pt that the address we have on file is correct. Pt verbalized understanding of results. Pt had no questions at this time but was encouraged to call back if questions arise. I have sent the order to [facility] and have received confirmation that they have received the order.

## 2019-07-07 NOTE — Telephone Encounter (Signed)
Oral Oncology Patient Advocate Encounter  Patient has been approved for copay assistance with [employer] (TAF).  [employer] will cover all copayment expenses for Zytiga for the remainder of the calendar year.    The billing information is as follows and has been shared with [place].   Member ID: [id number] Group ID: [id number] PCN: [id number] BIN: [id number] Eligibility Dates: 07/02/19 to 06/30/20  Fund: [place] Patient [place] Phone [phone] Fax [phone] 07/07/2019 11:43 AM

## 2019-07-08 MED FILL — ABIRATERONE ACETATE 250 MG: 250 | 30 days supply | Qty: 120 | Fill #1

## 2019-07-08 NOTE — Progress Notes (Signed)
Please call patient regarding the recent brain MRI: The brain scan showed a normal structure of the brain and no significant volume loss which we call atrophy. There were changes in the deeper structures of the brain, which we call white matter changes or microvascular changes. These were reported as moderate in His case. These are tiny white spots, that occur with time and are seen in a variety of conditions, including with normal aging, chronic hypertension, chronic headaches, especially migraine HAs, chronic diabetes, chronic hyperlipidemia. These are not strokes and no mass or lesion or contrast enhancement was seen which is reassuring. Again, there were no acute findings, such as a stroke, or mass or blood products. No obvious cause for his headaches.   No further action is required on this test at this time, other than re-enforcing the importance of good blood pressure control, good cholesterol control, good blood sugar control, and weight management. Please remind patient to keep any upcoming appointments or tests and to call us with any interim questions, concerns, problems or updates. Thanks,  Star Age, MD, PhD

## 2019-07-12 ENCOUNTER — Telehealth: Payer: Self-pay

## 2019-07-12 NOTE — Telephone Encounter (Signed)
Left vm for patient to call back about MRi brain results

## 2019-07-12 NOTE — Telephone Encounter (Signed)
-----   Message from Star Age, MD sent at 07/08/2019  2:54 PM EST ----- Please call patient regarding the recent brain MRI: The brain scan showed a normal structure of the brain and no significant volume loss which we call atrophy. There were changes in the deeper structures of the brain, which we call white matter changes or microvascular changes. These were reported as moderate in His case. These are tiny white spots, that occur with time and are seen in a variety of conditions, including with normal aging, chronic hypertension, chronic headaches, especially migraine HAs, chronic diabetes, chronic hyperlipidemia. These are not strokes and no mass or lesion or contrast enhancement was seen which is reassuring. Again, there were no acute findings, such as a stroke, or mass or blood products. No obvious cause for his headaches.   No further action is required on this test at this time, other than re-enforcing the importance of good blood pressure control, good cholesterol control, good blood sugar control, and weight management. Please remind patient to keep any upcoming appointments or tests and to call us with any interim questions, concerns, problems or updates. Thanks,  Star Age, MD, PhD

## 2019-07-12 NOTE — Telephone Encounter (Signed)
I called pt and gave him the listed from [place] below: Pt verbalized understanding. He was given follow up appt with Dr. Rexene Alberts that was already schedule.  The brain scan showed a normal structure of the brain and no significant volume loss which we call atrophy. There were changes in the deeper structures of the brain, which we call white matter changes or microvascular changes. These were reported as moderate in His case. These are tiny white spots, that occur with time and are seen in a variety of conditions, including with normal aging, chronic hypertension, chronic headaches, especially migraine HAs, chronic diabetes, chronic hyperlipidemia. These are not strokes and no mass or lesion or contrast enhancement was seen which is reassuring. Again, there were no acute findings, such as a stroke, or mass or blood products. No obvious cause for his headaches.   No further action is required on this test at this time, other than re-enforcing the importance of good blood pressure control, good cholesterol control, good blood sugar control, and weight management. Please remind patient to keep any upcoming appointments or tests and to call us with any interim questions, concerns, problems or updates.

## 2019-07-14 ENCOUNTER — Inpatient Hospital Stay: Payer: Medicare HMO

## 2019-07-14 ENCOUNTER — Telehealth: Payer: Self-pay | Admitting: Oncology

## 2019-07-14 ENCOUNTER — Other Ambulatory Visit: Payer: Self-pay

## 2019-07-14 ENCOUNTER — Inpatient Hospital Stay: Payer: Medicare HMO | Attending: Oncology | Admitting: Oncology

## 2019-07-14 VITALS — BP 122/79 | HR 74 | Temp 98.6°F | Resp 16 | Ht 70.0 in | Wt 183.3 lb

## 2019-07-14 DIAGNOSIS — C61 Malignant neoplasm of prostate: Secondary | ICD-10-CM | POA: Diagnosis present

## 2019-07-14 DIAGNOSIS — Z923 Personal history of irradiation: Secondary | ICD-10-CM | POA: Insufficient documentation

## 2019-07-14 DIAGNOSIS — E876 Hypokalemia: Secondary | ICD-10-CM | POA: Diagnosis not present

## 2019-07-14 DIAGNOSIS — I10 Essential (primary) hypertension: Secondary | ICD-10-CM | POA: Diagnosis not present

## 2019-07-14 DIAGNOSIS — C779 Secondary and unspecified malignant neoplasm of lymph node, unspecified: Secondary | ICD-10-CM | POA: Diagnosis not present

## 2019-07-14 DIAGNOSIS — Z79818 Long term (current) use of other agents affecting estrogen receptors and estrogen levels: Secondary | ICD-10-CM | POA: Diagnosis not present

## 2019-07-14 LAB — CMP (CANCER CENTER ONLY)
ALT: 41 U/L (ref 0–44)
AST: 35 U/L (ref 15–41)
Albumin: 3.7 g/dL (ref 3.5–5.0)
Alkaline Phosphatase: 126 U/L (ref 38–126)
Anion gap: 9 (ref 5–15)
BUN: 20 mg/dL (ref 8–23)
CO2: 23 mmol/L (ref 22–32)
Calcium: 9.4 mg/dL (ref 8.9–10.3)
Chloride: 106 mmol/L (ref 98–111)
Creatinine: 1.17 mg/dL (ref 0.61–1.24)
GFR, Est AFR Am: >60 mL/min (ref >60)
GFR, Estimated: >60 mL/min (ref >60)
Glucose, Bld: 96 mg/dL (ref 70–99)
Potassium: 4.1 mmol/L (ref 3.5–5.1)
Sodium: 138 mmol/L (ref 135–145)
Total Bilirubin: <0.2 mg/dL — ABNORMAL LOW (ref 0.3–1.2)
Total Protein: 7.6 g/dL (ref 6.5–8.1)

## 2019-07-14 LAB — CBC WITH DIFFERENTIAL (CANCER CENTER ONLY)
Abs Immature Granulocytes: 0.02 10*3/uL (ref 0.00–0.07)
Basophils Absolute: 0 10*3/uL (ref 0.0–0.1)
Basophils Relative: 1 %
Eosinophils Absolute: 1.2 10*3/uL — ABNORMAL HIGH (ref 0.0–0.5)
Eosinophils Relative: 17 %
HCT: 39.6 % (ref 39.0–52.0)
Hemoglobin: 13.5 g/dL (ref 13.0–17.0)
Immature Granulocytes: 0 %
Lymphocytes Relative: 28 %
Lymphs Abs: 2 10*3/uL (ref 0.7–4.0)
MCH: 30.9 pg (ref 26.0–34.0)
MCHC: 34.1 g/dL (ref 30.0–36.0)
MCV: 90.6 fL (ref 80.0–100.0)
Monocytes Absolute: 0.6 10*3/uL (ref 0.1–1.0)
Monocytes Relative: 8 %
Neutro Abs: 3.3 10*3/uL (ref 1.7–7.7)
Neutrophils Relative %: 46 %
Platelet Count: 149 10*3/uL — ABNORMAL LOW (ref 150–400)
RBC: 4.37 MIL/uL (ref 4.22–5.81)
RDW: 14.3 % (ref 11.5–15.5)
WBC Count: 7.1 10*3/uL (ref 4.0–10.5)
nRBC: 0 % (ref 0.0–0.2)

## 2019-07-14 MED ORDER — LEUPROLIDE ACETATE (4 MONTH) 30 MG ~~LOC~~ KIT
30.0000 mg | PACK | Freq: Once | SUBCUTANEOUS | Status: AC
  Administered 2019-07-14: 30 mg via SUBCUTANEOUS
  Filled 2019-07-14: qty 30

## 2019-07-14 NOTE — Progress Notes (Signed)
Hematology and Oncology Follow Up Visit  FIONA HOGENSON [id number] 08/22/1945 74 y.o. 07/14/2019 1:15 PM [name], MDJones, [name], MD   Principle Diagnosis: 74 year old man with advanced prostate cancer diagnosed in 2017.  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  Prior Therapy: He was treated with radiation therapy as a definitive modality well living in [place].  He developed a PSA recurrence in 2013 and his PSA was up to 2.9. He had a repeat biopsy at that time which showed a Gleason score 3+4 = 7. He was treated with cryoablation at that time.  In 2016 he had a elevated PSA up to 3.2 and a repeat biopsy in March 2016 showed a Gleason score 4+3 = 7 as well as a another foci of Gleason score 4+4 = 8. He underwent a repeat cryotherapy done while he was living in [place].  PSA went up to 2.96 and a PET scan obtained on 12/01/2015 showed marked hypermetabolic right pelvic sidewall lymph node consistent with metastatic disease. 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    Zytiga 1000 mg daily started in July 2017.  Interim History:  Mr. Thalheimer is here for return evaluation.  Since the last visit, he reports no major changes in his health.  He continues to feel well and remain active overall.  He denies any recent hospitalization or illnesses.  He denies any bone pain or pathological fractures.  Performance status activity level remains excellent.       Medications: Updated without any changes. Current Outpatient Medications  Medication Sig Dispense Refill  . abiraterone acetate (ZYTIGA) 250 MG tablet TAKE 4 TABLETS BY MOUTH DAILY. TAKE ON AN EMPTY STOMACH 1 HOUR BEFORE OR 2 HOURS AFTER A MEAL 120 tablet 1  . amLODipine (NORVASC) 10 MG tablet Take 10 mg by mouth daily.    Marland Kitchen atorvastatin  (LIPITOR) 40 MG tablet     . butalbital-acetaminophen-caffeine (FIORICET) 50-325-40 MG tablet Take by mouth 2 (two) times daily as needed for headache.     No current facility-administered medications for this visit.     Allergies: No Known Allergies     Physical Exam: Blood pressure 122/79, pulse 74, temperature 98.6 F (37 C), temperature source Temporal, resp. rate 16, height 5\' 10"  (1.778 m), weight 183 lb 4.8 oz (83.1 kg), SpO2 98 %.    ECOG: 0   General appearance: Comfortable appearing without any discomfort Head: Normocephalic without any trauma Oropharynx: Mucous membranes are moist and pink without any thrush or ulcers. Eyes: Pupils are equal and round reactive to light. Lymph nodes: No cervical, supraclavicular, inguinal or axillary lymphadenopathy.   Heart:regular rate and rhythm.  S1 and S2 without leg edema. Lung: Clear without any rhonchi or wheezes.  No dullness to percussion. Abdomin: Soft, nontender, nondistended with good bowel sounds.  No hepatosplenomegaly. Musculoskeletal: No joint deformity or effusion.  Full range of motion noted. Neurological: No deficits noted on motor, sensory and deep tendon reflex exam. Skin: No petechial rash or dryness.  Appeared moist.       Lab Results: Lab Results  Component Value Date   WBC 4.7 03/16/2019   HGB 12.8 (L) 03/16/2019   HCT 38.2 (L) 03/16/2019   MCV 90.7 03/16/2019   PLT 137 (L) 03/16/2019     Chemistry  Component Value Date/Time   NA 138 06/09/2019 1228   NA 140 06/05/2017 1003   K 4.2 06/09/2019 1228   K 4.0 06/05/2017 1003   CL 104 06/09/2019 1228   CO2 22 06/09/2019 1228   CO2 27 06/05/2017 1003   BUN 22 06/09/2019 1228   BUN 25.2 06/05/2017 1003   CREATININE 1.06 06/09/2019 1228   CREATININE 1.27 (H) 03/16/2019 1202   CREATININE 1.3 06/05/2017 1003      Component Value Date/Time   CALCIUM 9.8 06/09/2019 1228   CALCIUM 9.9 06/05/2017 1003   ALKPHOS 134 (H) 06/09/2019 1228    ALKPHOS 78 06/05/2017 1003   AST 36 06/09/2019 1228   AST 31 03/16/2019 1202   AST 35 (H) 06/05/2017 1003   ALT 30 06/09/2019 1228   ALT 22 03/16/2019 1202   ALT 63 (H) 06/05/2017 1003   BILITOT <0.2 06/09/2019 1228   BILITOT 0.3 03/16/2019 1202   BILITOT 0.37 06/05/2017 1003          Results for SEENA, ZIMMERER (MRN [id number]) as of 07/14/2019 13:33  Ref. Range 06/02/2018 09:25 03/16/2019 12:02  Prostate Specific Ag, Serum Latest Ref Range: 0.0 - 4.0 ng/mL <0.1 <0.1        Impression and Plan:  74 year old man with:  1.  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  He remains on Zytiga without any major complications at this time.  His PSA continues to be undetectable indicating excellent response to therapy at this time.  Risks and benefits of continuing this therapy was discussed today.  Alternative options if he developed castration-resistant disease was reviewed including systemic chemotherapy.  At this time he will continue with the same dose and schedule.   2. Hypertension: His blood pressure is within normal range at this time.  We will continue to monitor this on Zytiga.  3. Hypokalemia: His potassium has been close to normal range although we will continue to monitor on Zytiga.  4. Androgen depravation therapy: He is currently on Eligard and will be received every 4 months.  Complications including weight gain, osteoporosis were reviewed.  He is agreeable to continue at this time.  5.  Goals of care and prognosis: Therapy remains palliative at this time although aggressive measures are warranted he was an excellent performance status.  6. Follow-up: He will return in 4 months for repeat evaluation.  30  minutes was spent on this encounter.  This time was dedicated to reviewing his disease, reviewing laboratory data, discussing treatment options and future complications related  to this therapy.  Zola Button, MD 1/13/20211:15 PM

## 2019-07-14 NOTE — Telephone Encounter (Signed)
Scheduled appt per 1/13 los.  Sent a message to HIM pool to get a calendar mailed out. 

## 2019-07-15 ENCOUNTER — Telehealth: Payer: Self-pay

## 2019-07-15 LAB — PROSTATE-SPECIFIC AG, SERUM (LABCORP): Prostate Specific Ag, Serum: <0.1 ng/mL (ref 0.0–4.0)

## 2019-07-15 NOTE — Telephone Encounter (Signed)
-----   Message from Wyatt Portela, MD sent at 07/15/2019  8:18 AM EST ----- Please let him know his PSA is still low

## 2019-07-15 NOTE — Telephone Encounter (Signed)
Called patient and made him aware of PSA result. Patient verbalized understanding.  

## 2019-07-30 NOTE — Telephone Encounter (Signed)
Received a notification from [place],  "Patient was scheduled for 01/19 for set up with [facility]. Patient called in to cancel his appointment on 01/18 and said he would call back to reschedule. 01/28 called patient to reschedule, he said he has too much going on and cannot commit to an appointment. I am voiding out this order, just wanted to make you all aware. "

## 2019-08-02 ENCOUNTER — Other Ambulatory Visit: Payer: Self-pay | Admitting: Oncology

## 2019-08-02 DIAGNOSIS — C61 Malignant neoplasm of prostate: Secondary | ICD-10-CM

## 2019-08-12 MED FILL — ABIRATERONE ACETATE 250 MG: 250 | 30 days supply | Qty: 120 | Fill #0

## 2019-09-07 ENCOUNTER — Telehealth: Payer: Self-pay

## 2019-09-07 NOTE — Telephone Encounter (Signed)
Pt has an appt on 09/13/19 for his initial cpap. Pt has not started cpap.   I called pt to discuss. No answer, left a message asking him to call me back.  If pt calls back please ask him if he plans on keeping his appt on Monday to discuss headaches and/or cpap and sleep.

## 2019-09-09 MED FILL — ABIRATERONE ACETATE 250 MG: 250 | 30 days supply | Qty: 120 | Fill #1

## 2019-09-13 ENCOUNTER — Ambulatory Visit: Payer: Self-pay | Admitting: Neurology

## 2019-10-05 ENCOUNTER — Other Ambulatory Visit: Payer: Self-pay | Admitting: Oncology

## 2019-10-05 DIAGNOSIS — C61 Malignant neoplasm of prostate: Secondary | ICD-10-CM

## 2019-10-07 MED FILL — ABIRATERONE ACETATE 250 MG: 250 | 30 days supply | Qty: 120 | Fill #0

## 2019-11-10 ENCOUNTER — Other Ambulatory Visit: Payer: Self-pay

## 2019-11-10 DIAGNOSIS — C61 Malignant neoplasm of prostate: Secondary | ICD-10-CM

## 2019-11-11 ENCOUNTER — Inpatient Hospital Stay: Payer: Medicare HMO | Attending: Oncology

## 2019-11-11 ENCOUNTER — Other Ambulatory Visit: Payer: Self-pay

## 2019-11-11 ENCOUNTER — Inpatient Hospital Stay (HOSPITAL_BASED_OUTPATIENT_CLINIC_OR_DEPARTMENT_OTHER): Payer: Medicare HMO | Admitting: Oncology

## 2019-11-11 ENCOUNTER — Telehealth: Payer: Self-pay | Admitting: Oncology

## 2019-11-11 ENCOUNTER — Inpatient Hospital Stay: Payer: Medicare HMO

## 2019-11-11 VITALS — BP 126/78 | HR 64 | Temp 98.4°F | Resp 18 | Wt 184.6 lb

## 2019-11-11 DIAGNOSIS — Z191 Hormone sensitive malignancy status: Secondary | ICD-10-CM | POA: Diagnosis not present

## 2019-11-11 DIAGNOSIS — Z79899 Other long term (current) drug therapy: Secondary | ICD-10-CM | POA: Diagnosis not present

## 2019-11-11 DIAGNOSIS — Z79818 Long term (current) use of other agents affecting estrogen receptors and estrogen levels: Secondary | ICD-10-CM | POA: Diagnosis not present

## 2019-11-11 DIAGNOSIS — Z923 Personal history of irradiation: Secondary | ICD-10-CM | POA: Diagnosis not present

## 2019-11-11 DIAGNOSIS — C61 Malignant neoplasm of prostate: Secondary | ICD-10-CM

## 2019-11-11 DIAGNOSIS — C775 Secondary and unspecified malignant neoplasm of intrapelvic lymph nodes: Secondary | ICD-10-CM | POA: Insufficient documentation

## 2019-11-11 DIAGNOSIS — I10 Essential (primary) hypertension: Secondary | ICD-10-CM | POA: Insufficient documentation

## 2019-11-11 LAB — CMP (CANCER CENTER ONLY)
ALT: 25 U/L (ref 0–44)
AST: 30 U/L (ref 15–41)
Albumin: 3.7 g/dL (ref 3.5–5.0)
Alkaline Phosphatase: 115 U/L (ref 38–126)
Anion gap: 8 (ref 5–15)
BUN: 18 mg/dL (ref 8–23)
CO2: 26 mmol/L (ref 22–32)
Calcium: 9.7 mg/dL (ref 8.9–10.3)
Chloride: 105 mmol/L (ref 98–111)
Creatinine: 1.13 mg/dL (ref 0.61–1.24)
GFR, Est AFR Am: >60 mL/min (ref >60)
GFR, Estimated: >60 mL/min (ref >60)
Glucose, Bld: 93 mg/dL (ref 70–99)
Potassium: 4.8 mmol/L (ref 3.5–5.1)
Sodium: 139 mmol/L (ref 135–145)
Total Bilirubin: 0.3 mg/dL (ref 0.3–1.2)
Total Protein: 7.8 g/dL (ref 6.5–8.1)

## 2019-11-11 LAB — CBC WITH DIFFERENTIAL (CANCER CENTER ONLY)
Abs Immature Granulocytes: 0.01 10*3/uL (ref 0.00–0.07)
Basophils Absolute: 0 10*3/uL (ref 0.0–0.1)
Basophils Relative: 1 %
Eosinophils Absolute: 0.6 10*3/uL — ABNORMAL HIGH (ref 0.0–0.5)
Eosinophils Relative: 10 %
HCT: 37.3 % — ABNORMAL LOW (ref 39.0–52.0)
Hemoglobin: 12.4 g/dL — ABNORMAL LOW (ref 13.0–17.0)
Immature Granulocytes: 0 %
Lymphocytes Relative: 30 %
Lymphs Abs: 1.9 10*3/uL (ref 0.7–4.0)
MCH: 30.4 pg (ref 26.0–34.0)
MCHC: 33.2 g/dL (ref 30.0–36.0)
MCV: 91.4 fL (ref 80.0–100.0)
Monocytes Absolute: 0.5 10*3/uL (ref 0.1–1.0)
Monocytes Relative: 9 %
Neutro Abs: 3.1 10*3/uL (ref 1.7–7.7)
Neutrophils Relative %: 50 %
Platelet Count: 137 10*3/uL — ABNORMAL LOW (ref 150–400)
RBC: 4.08 MIL/uL — ABNORMAL LOW (ref 4.22–5.81)
RDW: 14.1 % (ref 11.5–15.5)
WBC Count: 6.2 10*3/uL (ref 4.0–10.5)
nRBC: 0 % (ref 0.0–0.2)

## 2019-11-11 MED ORDER — LEUPROLIDE ACETATE (4 MONTH) 30 MG ~~LOC~~ KIT
30.0000 mg | PACK | Freq: Once | SUBCUTANEOUS | Status: AC
  Administered 2019-11-11: 30 mg via SUBCUTANEOUS
  Filled 2019-11-11: qty 30

## 2019-11-11 NOTE — Patient Instructions (Signed)

## 2019-11-11 NOTE — Progress Notes (Signed)
Hematology and Oncology Follow Up Visit  James [name] [id number] May 09, 1946 74 y.o. 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  He was initially diagnosed with in 2013.  Prior Therapy: He was treated with radiation therapy as a definitive modality well living in [place].  He developed a PSA recurrence in 2013 and his PSA was up to 2.9. He had a repeat biopsy at that time which showed a Gleason score 3+4 = 7. He was treated with cryoablation at that time.  In 2016 he had a elevated PSA up to 3.2 and a repeat biopsy in March 2016 showed a Gleason score 4+3 = 7 as well as a another foci of Gleason score 4+4 = 8. He underwent a repeat cryotherapy done while he was living in [place].  PSA went up to 2.96 and a PET scan obtained on 12/01/2015 showed marked hypermetabolic right pelvic sidewall lymph node consistent with metastatic disease. 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    He is currently receiving Eligard every 4 months and will receive his next injection on Nov 11, 2019.  Zytiga 1000 mg daily started in July 2017.  Interim History:  James [name] is here for a follow-up visit.  Since the last visit, he reports no recent complaints.  He continues to tolerate Zytiga without any recent hospitalizations or illnesses.  He denies any nausea, vomiting or edema.  He denies any bone pain or pathological fractures.  Continues to enjoy excellent performance status and quality of life.  Denies any recent hospitalization or illnesses.       Medications:  reviewed without changes. Current Outpatient Medications  Medication Sig Dispense Refill  . abiraterone acetate (ZYTIGA) 250 MG tablet TAKE 4 TABLETS BY MOUTH DAILY. TAKE ON AN  EMPTY STOMACH 1 HOUR BEFORE OR 2 HOURS AFTER A MEAL 120 tablet 1  . amLODipine (NORVASC) 10 MG tablet Take 10 mg by mouth daily.    Marland Kitchen atorvastatin (LIPITOR) 40 MG tablet     . butalbital-acetaminophen-caffeine (FIORICET) 50-325-40 MG tablet Take by mouth 2 (two) times daily as needed for headache.     No current facility-administered medications for this visit.     Allergies: No Known Allergies     Physical Exam:  Blood pressure 126/78, pulse 64, temperature 98.4 F (36.9 C), temperature source Oral, resp. rate 18, weight 184 lb 9.6 oz (83.7 kg), SpO2 100 %.   ECOG: 0   General appearance: Alert, awake without any distress. Head: Atraumatic without abnormalities Oropharynx: Without any thrush or ulcers. Eyes: No scleral icterus. Lymph nodes: No lymphadenopathy noted in the cervical, supraclavicular, or axillary nodes Heart:regular rate and rhythm, without any murmurs or gallops.   Lung: Clear to auscultation without any rhonchi, wheezes or dullness to percussion. Abdomin: Soft, nontender without any shifting dullness or ascites. Musculoskeletal: No clubbing or cyanosis. Neurological: No motor or sensory deficits. Skin: No rashes or lesions.       Lab Results: Lab Results  Component Value Date   WBC 7.1 07/14/2019   HGB 13.5 07/14/2019   HCT 39.6 07/14/2019   MCV 90.6 07/14/2019   PLT 149 (L) 07/14/2019     Chemistry      Component Value Date/Time   NA 138 07/14/2019 1307  NA 138 06/09/2019 1228   NA 140 06/05/2017 1003   K 4.1 07/14/2019 1307   K 4.0 06/05/2017 1003   CL 106 07/14/2019 1307   CO2 23 07/14/2019 1307   CO2 27 06/05/2017 1003   BUN 20 07/14/2019 1307   BUN 22 06/09/2019 1228   BUN 25.2 06/05/2017 1003   CREATININE 1.17 07/14/2019 1307   CREATININE 1.3 06/05/2017 1003      Component Value Date/Time   CALCIUM 9.4 07/14/2019 1307   CALCIUM 9.9 06/05/2017 1003   ALKPHOS 126 07/14/2019 1307   ALKPHOS 78 06/05/2017 1003   AST 35  07/14/2019 1307   AST 35 (H) 06/05/2017 1003   ALT 41 07/14/2019 1307   ALT 63 (H) 06/05/2017 1003   BILITOT <0.2 (L) 07/14/2019 1307   BILITOT 0.37 06/05/2017 1003         Results for James [name] (MRN [id number]) as of 11/11/2019 13:00  Ref. Range 07/14/2019 13:07  Prostate Specific Ag, Serum Latest Ref Range: 0.0 - 4.0 ng/mL <0.1          Impression and Plan:  74 year old man with:  1.  Castration-sensitive prostate cancer with pelvic adenopathy noted in 2017.   The natural course of his disease was reviewed today and treatment options were updated.  He is currently on Zytiga with excellent PSA response and reasonable tolerance.  Risks and benefits of continuing this treatment with complications such as hypertension, edema and hypokalemia were reiterated.  Systemic chemotherapy, Trudi Ida among other options are considered if he developed castration-resistant disease.   He is agreeable to continue Zytiga for the time being.   2. Hypertension: He has no issues reported with his blood pressure previously.  This will be monitored on Zytiga.  His blood pressure today continues to be within normal range.  3. Hypokalemia: No issues noted on his potassium for the time being.  4. Androgen depravation therapy: He will receive Eligard today and repeated in 4 months.  Complication associated with this treatment including weight gain, hot flashes among others were reviewed.  5.  Goals of care and prognosis: His disease remains incurable although aggressive measures are warranted given his excellent performance status and current response to therapy.   6.  Covid vaccine considerations: He has completed his vaccine series in March 2021.  6. Follow-up: In 4 months for repeat evaluation.  30  minutes were dedicated to this visit.  The time was spent on reviewing laboratory data, updating his disease status, addressing complications related to his cancer and cancer therapy.  Zola Button, MD 5/13/20211:11 PM

## 2019-11-11 NOTE — Telephone Encounter (Signed)
Scheduled appt per 5/13 los - gave patient AVS and calender per los.  

## 2019-11-12 ENCOUNTER — Telehealth: Payer: Self-pay

## 2019-11-12 LAB — PROSTATE-SPECIFIC AG, SERUM (LABCORP): Prostate Specific Ag, Serum: <0.1 ng/mL (ref 0.0–4.0)

## 2019-11-12 NOTE — Telephone Encounter (Signed)
-----   Message from Wyatt Portela, MD sent at 11/12/2019  8:39 AM EDT ----- Please let him know his PSA is still low

## 2019-11-12 NOTE — Telephone Encounter (Signed)
Called patient and let him know that per Dr. Alen Blew PSA is still low.

## 2019-11-24 ENCOUNTER — Other Ambulatory Visit: Payer: Self-pay | Admitting: Oncology

## 2019-11-24 DIAGNOSIS — C61 Malignant neoplasm of prostate: Secondary | ICD-10-CM

## 2019-12-17 ENCOUNTER — Other Ambulatory Visit: Payer: Self-pay | Admitting: Urology

## 2019-12-17 DIAGNOSIS — Z79899 Other long term (current) drug therapy: Secondary | ICD-10-CM

## 2020-01-06 MED FILL — ABIRATERONE ACETATE 250 MG: 250 | 30 days supply | Qty: 120 | Fill #1

## 2020-02-02 ENCOUNTER — Other Ambulatory Visit: Payer: Self-pay | Admitting: Oncology

## 2020-02-02 DIAGNOSIS — C61 Malignant neoplasm of prostate: Secondary | ICD-10-CM

## 2020-02-09 MED FILL — ABIRATERONE ACETATE 250 MG: 250 | 30 days supply | Qty: 120 | Fill #0

## 2020-03-08 ENCOUNTER — Ambulatory Visit
Admission: RE | Admit: 2020-03-08 | Discharge: 2020-03-08 | Disposition: A | Discharge status: Home / Self Care | Payer: Medicare HMO | Source: Ambulatory Visit | Attending: Urology | Admitting: Urology

## 2020-03-08 ENCOUNTER — Other Ambulatory Visit: Payer: Self-pay

## 2020-03-08 DIAGNOSIS — Z79899 Other long term (current) drug therapy: Secondary | ICD-10-CM

## 2020-03-09 MED FILL — ABIRATERONE ACETATE 250 MG: 250 | 30 days supply | Qty: 120 | Fill #1

## 2020-03-16 ENCOUNTER — Inpatient Hospital Stay: Payer: Medicare HMO | Attending: Oncology

## 2020-03-16 ENCOUNTER — Inpatient Hospital Stay (HOSPITAL_BASED_OUTPATIENT_CLINIC_OR_DEPARTMENT_OTHER): Payer: Medicare HMO | Admitting: Oncology

## 2020-03-16 ENCOUNTER — Other Ambulatory Visit: Payer: Self-pay

## 2020-03-16 ENCOUNTER — Inpatient Hospital Stay: Payer: Medicare HMO

## 2020-03-16 VITALS — BP 122/87 | HR 60 | Temp 97.8°F | Resp 18 | Ht 70.0 in | Wt 173.8 lb

## 2020-03-16 DIAGNOSIS — E876 Hypokalemia: Secondary | ICD-10-CM | POA: Diagnosis not present

## 2020-03-16 DIAGNOSIS — Z79899 Other long term (current) drug therapy: Secondary | ICD-10-CM | POA: Insufficient documentation

## 2020-03-16 DIAGNOSIS — C61 Malignant neoplasm of prostate: Secondary | ICD-10-CM | POA: Diagnosis not present

## 2020-03-16 DIAGNOSIS — I10 Essential (primary) hypertension: Secondary | ICD-10-CM | POA: Insufficient documentation

## 2020-03-16 LAB — CBC WITH DIFFERENTIAL (CANCER CENTER ONLY)
Abs Immature Granulocytes: 0 10*3/uL (ref 0.00–0.07)
Basophils Absolute: 0 10*3/uL (ref 0.0–0.1)
Basophils Relative: 1 %
Eosinophils Absolute: 0.7 10*3/uL — ABNORMAL HIGH (ref 0.0–0.5)
Eosinophils Relative: 11 %
HCT: 38.3 % — ABNORMAL LOW (ref 39.0–52.0)
Hemoglobin: 12.8 g/dL — ABNORMAL LOW (ref 13.0–17.0)
Immature Granulocytes: 0 %
Lymphocytes Relative: 28 %
Lymphs Abs: 1.7 10*3/uL (ref 0.7–4.0)
MCH: 30 pg (ref 26.0–34.0)
MCHC: 33.4 g/dL (ref 30.0–36.0)
MCV: 89.7 fL (ref 80.0–100.0)
Monocytes Absolute: 0.5 10*3/uL (ref 0.1–1.0)
Monocytes Relative: 8 %
Neutro Abs: 3.3 10*3/uL (ref 1.7–7.7)
Neutrophils Relative %: 52 %
Platelet Count: 132 10*3/uL — ABNORMAL LOW (ref 150–400)
RBC: 4.27 MIL/uL (ref 4.22–5.81)
RDW: 14.6 % (ref 11.5–15.5)
WBC Count: 6.3 10*3/uL (ref 4.0–10.5)
nRBC: 0 % (ref 0.0–0.2)

## 2020-03-16 LAB — CMP (CANCER CENTER ONLY)
ALT: 22 U/L (ref 0–44)
AST: 29 U/L (ref 15–41)
Albumin: 3.6 g/dL (ref 3.5–5.0)
Alkaline Phosphatase: 131 U/L — ABNORMAL HIGH (ref 38–126)
Anion gap: 7 (ref 5–15)
BUN: 18 mg/dL (ref 8–23)
CO2: 27 mmol/L (ref 22–32)
Calcium: 9.9 mg/dL (ref 8.9–10.3)
Chloride: 106 mmol/L (ref 98–111)
Creatinine: 1.13 mg/dL (ref 0.61–1.24)
GFR, Est AFR Am: >60 mL/min (ref >60)
GFR, Estimated: >60 mL/min (ref >60)
Glucose, Bld: 89 mg/dL (ref 70–99)
Potassium: 3.8 mmol/L (ref 3.5–5.1)
Sodium: 140 mmol/L (ref 135–145)
Total Bilirubin: 0.3 mg/dL (ref 0.3–1.2)
Total Protein: 8 g/dL (ref 6.5–8.1)

## 2020-03-16 MED ORDER — LEUPROLIDE ACETATE (4 MONTH) 30 MG ~~LOC~~ KIT
PACK | SUBCUTANEOUS | Status: AC
  Filled 2020-03-16: qty 30

## 2020-03-16 MED ORDER — LEUPROLIDE ACETATE (4 MONTH) 30 MG ~~LOC~~ KIT
30.0000 mg | PACK | Freq: Once | SUBCUTANEOUS | Status: AC
  Administered 2020-03-16: 30 mg via SUBCUTANEOUS

## 2020-03-16 NOTE — Patient Instructions (Signed)

## 2020-03-16 NOTE — Progress Notes (Signed)
Hematology and Oncology Follow Up Visit  James [name] [id number] 1945-07-30 74 y.o. 03/16/2020 1:30 PM [name], MDJones, [name], MD   Principle Diagnosis: 74 year old man with advanced prostate cancer with lymphadenopathy diagnosed in 2017.  He has castration-sensitive disease.   Prior Therapy: He was treated with radiation therapy as a definitive modality well living in [place].  He developed a PSA recurrence in 2013 and his PSA was up to 2.9. He had a repeat biopsy at that time which showed a Gleason score 3+4 = 7. He was treated with cryoablation at that time.  In 2016 he had a elevated PSA up to 3.2 and a repeat biopsy in March 2016 showed a Gleason score 4+3 = 7 as well as a another foci of Gleason score 4+4 = 8. He underwent a repeat cryotherapy done while he was living in [place].  PSA went up to 2.96 and a PET scan obtained on 12/01/2015 showed marked hypermetabolic right pelvic sidewall lymph node consistent with metastatic disease. He underwent a biopsy on 12/15/2015 and the biopsy confirmed the presence of metastatic prostate cancer   Current therapy:  Eligard 30 mg every 4 months.  He will receive it today and repeated in January 2022.  Zytiga 1000 mg daily started in July 2017.  Interim History:  James [name] returns today for a follow-up evaluation.  Since the last visit, he reports feeling well without any recent complaints.  He denies any nausea, vomiting or abdominal pain.  Denies excessive fatigue or tiredness.  Performance status and quality of life remained excellent.  He denies any recent hospitalization or illnesses.       Medications: Updated on review. Current Outpatient Medications  Medication Sig Dispense Refill   abiraterone acetate (ZYTIGA) 250 MG tablet TAKE 4 TABLETS BY MOUTH DAILY. TAKE ON AN EMPTY STOMACH 1 HOUR BEFORE OR 2 HOURS AFTER A MEAL 120 tablet 1   amLODipine (NORVASC) 10 MG tablet Take 10 mg by mouth daily.     atorvastatin  (LIPITOR) 40 MG tablet      butalbital-acetaminophen-caffeine (FIORICET) 50-325-40 MG tablet Take by mouth 2 (two) times daily as needed for headache.     No current facility-administered medications for this visit.     Allergies: No Known Allergies     Physical Exam:  Blood pressure 122/87, pulse 60, temperature 97.8 F (36.6 C), temperature source Tympanic, resp. rate 18, height 5\' 10"  (1.778 m), weight 173 lb 12.8 oz (78.8 kg), SpO2 100 %.   ECOG: 0    General appearance: Comfortable appearing without any discomfort Head: Normocephalic without any trauma Oropharynx: Mucous membranes are moist and pink without any thrush or ulcers. Eyes: Pupils are equal and round reactive to light. Lymph nodes: No cervical, supraclavicular, inguinal or axillary lymphadenopathy.   Heart:regular rate and rhythm.  S1 and S2 without leg edema. Lung: Clear without any rhonchi or wheezes.  No dullness to percussion. Abdomin: Soft, nontender, nondistended with good bowel sounds.  No hepatosplenomegaly. Musculoskeletal: No joint deformity or effusion.  Full range of motion noted. Neurological: No deficits noted on motor, sensory and deep tendon reflex exam. Skin: No petechial rash or dryness.  Appeared moist.         Lab Results: Lab Results  Component Value Date   WBC 6.3 03/16/2020   HGB 12.8 (L) 03/16/2020   HCT 38.3 (L) 03/16/2020   MCV 89.7 03/16/2020   PLT 132 (L) 03/16/2020     Chemistry  Component Value Date/Time   NA 139 11/11/2019 1256   NA 138 06/09/2019 1228   NA 140 06/05/2017 1003   K 4.8 11/11/2019 1256   K 4.0 06/05/2017 1003   CL 105 11/11/2019 1256   CO2 26 11/11/2019 1256   CO2 27 06/05/2017 1003   BUN 18 11/11/2019 1256   BUN 22 06/09/2019 1228   BUN 25.2 06/05/2017 1003   CREATININE 1.13 11/11/2019 1256   CREATININE 1.3 06/05/2017 1003      Component Value Date/Time   CALCIUM 9.7 11/11/2019 1256   CALCIUM 9.9 06/05/2017 1003   ALKPHOS 115  11/11/2019 1256   ALKPHOS 78 06/05/2017 1003   AST 30 11/11/2019 1256   AST 35 (H) 06/05/2017 1003   ALT 25 11/11/2019 1256   ALT 63 (H) 06/05/2017 1003   BILITOT 0.3 11/11/2019 1256   BILITOT 0.37 06/05/2017 1003          Results for James, [name] (MRN [id number]) as of 03/16/2020 13:31  Ref. Range 07/14/2019 13:07 11/11/2019 12:56  Prostate Specific Ag, Serum Latest Ref Range: 0.0 - 4.0 ng/mL <0.1 <0.1          Impression and Plan:  74 year old man with:  1.  Advanced prostate cancer with pelvic adenopathy diagnosed in 2017.  He has castration-sensitive disease.  He is currently on Zytiga which she has tolerated very well with excellent PSA response.  Risks and benefits of continuing this treatments include nausea, fatigue, edema and adrenal insufficiency among others were reviewed.  He is agreeable to continue.   2. Hypertension: His blood pressure is within normal range at this time without any adjustment needed.  We will continue to monitor on Zytiga.  3. Hypokalemia: His potassium is within normal range and will continue to monitor on Zytiga.  4. Androgen depravation therapy: He is currently on Eligard which will be given every 4 months.  Complications including hot flashes, weight gain among others were reviewed.  5.  Goals of care and prognosis: Therapy is palliative at this time although aggressive measures are warranted given his excellent performance status.  6. Follow-up: He will return in 4 months for follow-up and repeat Eligard.  30  minutes were spent on this encounter.  The time was dedicated to reviewing his disease status, treatment options and future plan of care review.  Zola Button, MD 9/16/20211:30 PM

## 2020-03-17 LAB — PROSTATE-SPECIFIC AG, SERUM (LABCORP): Prostate Specific Ag, Serum: <0.1 ng/mL (ref 0.0–4.0)

## 2020-04-03 ENCOUNTER — Other Ambulatory Visit: Payer: Self-pay | Admitting: Oncology

## 2020-04-03 DIAGNOSIS — C61 Malignant neoplasm of prostate: Secondary | ICD-10-CM

## 2020-04-10 MED FILL — ABIRATERONE ACETATE 250 MG: 250 | 30 days supply | Qty: 120 | Fill #0

## 2020-05-09 MED FILL — ABIRATERONE ACETATE 250 MG: 250 | 30 days supply | Qty: 120 | Fill #1

## 2020-06-08 ENCOUNTER — Other Ambulatory Visit: Payer: Self-pay | Admitting: Oncology

## 2020-06-08 DIAGNOSIS — C61 Malignant neoplasm of prostate: Secondary | ICD-10-CM

## 2020-06-08 MED FILL — ABIRATERONE ACETATE 250 MG: 250 | 30 days supply | Qty: 120 | Fill #0

## 2020-07-06 MED FILL — ABIRATERONE ACETATE 250 MG: 250 | 30 days supply | Qty: 120 | Fill #1

## 2020-07-10 ENCOUNTER — Telehealth: Payer: Self-pay

## 2020-07-10 NOTE — Telephone Encounter (Signed)
Oral Oncology Patient Advocate Encounter   Was successful in securing patient an [phone] grant from Patient [facility]) to provide copayment coverage for Zytiga.  This will keep the out of pocket expense at $0.     I have spoken with the patient.    The billing information is as follows and has been shared with [place].   Member ID: [id number] Group ID: [id number] RxBin: [id number] Dates of Eligibility: 07/05/20 through 07/04/21  Fund:  [place] Patient [place] Phone [phone] Fax [phone] 07/10/2020 11:10 AM

## 2020-08-02 ENCOUNTER — Other Ambulatory Visit: Payer: Self-pay | Admitting: Oncology

## 2020-08-02 DIAGNOSIS — C61 Malignant neoplasm of prostate: Secondary | ICD-10-CM

## 2020-08-07 ENCOUNTER — Telehealth: Payer: Self-pay

## 2020-08-07 NOTE — Telephone Encounter (Signed)
Patient was disenrolled due to not providing financial documents Pan asked for.

## 2020-08-07 NOTE — Telephone Encounter (Signed)
Oral Oncology Patient Advocate Encounter  Met patient in lobby to complete application for Wynetta Emery and [place] in an effort to reduce patient's out of pocket expense for Zytiga to $0.    Application completed and faxed to [phone].   [employer] patient assistance phone number for follow up is [phone].   This encounter will be updated until final determination.  [place] Patient [place] Phone [phone] Fax [phone] 08/07/2020 10:11 AM

## 2020-08-11 NOTE — Telephone Encounter (Signed)
Patient is temporarily approved for Zytiga at no cost from [employer] 08/10/20-10/09/20.   Patient must sign a Medicare D attestation form to be covered through 06/30/21  [place] Patient [place] Phone [phone] Fax [phone] 08/11/2020 9:06 AM

## 2020-09-08 ENCOUNTER — Telehealth: Payer: Self-pay | Admitting: Oncology

## 2020-09-08 NOTE — Telephone Encounter (Signed)
Scheduled per 03/10 scheduled message, patient is notified of next upcoming appointment.

## 2020-09-12 ENCOUNTER — Other Ambulatory Visit: Payer: Self-pay | Admitting: *Deleted

## 2020-09-12 DIAGNOSIS — C61 Malignant neoplasm of prostate: Secondary | ICD-10-CM

## 2020-09-12 MED ORDER — ABIRATERONE ACETATE 250 MG PO TABS: ORAL_TABLET | ORAL | 3 refills | Status: DC

## 2020-09-22 ENCOUNTER — Other Ambulatory Visit: Payer: Self-pay

## 2020-09-22 ENCOUNTER — Inpatient Hospital Stay: Payer: Medicare HMO | Attending: Oncology | Admitting: Oncology

## 2020-09-22 ENCOUNTER — Inpatient Hospital Stay: Payer: Medicare HMO

## 2020-09-22 VITALS — BP 114/76 | HR 63 | Temp 97.0°F | Resp 18 | Ht 70.0 in | Wt 178.6 lb

## 2020-09-22 DIAGNOSIS — I10 Essential (primary) hypertension: Secondary | ICD-10-CM | POA: Diagnosis not present

## 2020-09-22 DIAGNOSIS — Z79899 Other long term (current) drug therapy: Secondary | ICD-10-CM | POA: Insufficient documentation

## 2020-09-22 DIAGNOSIS — C61 Malignant neoplasm of prostate: Secondary | ICD-10-CM

## 2020-09-22 DIAGNOSIS — Z79818 Long term (current) use of other agents affecting estrogen receptors and estrogen levels: Secondary | ICD-10-CM | POA: Diagnosis not present

## 2020-09-22 DIAGNOSIS — Z191 Hormone sensitive malignancy status: Secondary | ICD-10-CM | POA: Insufficient documentation

## 2020-09-22 DIAGNOSIS — Z923 Personal history of irradiation: Secondary | ICD-10-CM | POA: Diagnosis not present

## 2020-09-22 DIAGNOSIS — E876 Hypokalemia: Secondary | ICD-10-CM | POA: Insufficient documentation

## 2020-09-22 LAB — CBC WITH DIFFERENTIAL (CANCER CENTER ONLY)
Abs Immature Granulocytes: 0.01 10*3/uL (ref 0.00–0.07)
Basophils Absolute: 0 10*3/uL (ref 0.0–0.1)
Basophils Relative: 1 %
Eosinophils Absolute: 0.1 10*3/uL (ref 0.0–0.5)
Eosinophils Relative: 3 %
HCT: 35.6 % — ABNORMAL LOW (ref 39.0–52.0)
Hemoglobin: 12 g/dL — ABNORMAL LOW (ref 13.0–17.0)
Immature Granulocytes: 0 %
Lymphocytes Relative: 32 %
Lymphs Abs: 1.6 10*3/uL (ref 0.7–4.0)
MCH: 28.8 pg (ref 26.0–34.0)
MCHC: 33.7 g/dL (ref 30.0–36.0)
MCV: 85.6 fL (ref 80.0–100.0)
Monocytes Absolute: 0.5 10*3/uL (ref 0.1–1.0)
Monocytes Relative: 10 %
Neutro Abs: 2.8 10*3/uL (ref 1.7–7.7)
Neutrophils Relative %: 54 %
Platelet Count: 162 10*3/uL (ref 150–400)
RBC: 4.16 MIL/uL — ABNORMAL LOW (ref 4.22–5.81)
RDW: 15.2 % (ref 11.5–15.5)
WBC Count: 5.1 10*3/uL (ref 4.0–10.5)
nRBC: 0 % (ref 0.0–0.2)

## 2020-09-22 LAB — CMP (CANCER CENTER ONLY)
ALT: 19 U/L (ref 0–44)
AST: 25 U/L (ref 15–41)
Albumin: 3.7 g/dL (ref 3.5–5.0)
Alkaline Phosphatase: 108 U/L (ref 38–126)
Anion gap: 11 (ref 5–15)
BUN: 23 mg/dL (ref 8–23)
CO2: 22 mmol/L (ref 22–32)
Calcium: 9.8 mg/dL (ref 8.9–10.3)
Chloride: 106 mmol/L (ref 98–111)
Creatinine: 1.19 mg/dL (ref 0.61–1.24)
GFR, Estimated: >60 mL/min (ref >60)
Glucose, Bld: 100 mg/dL — ABNORMAL HIGH (ref 70–99)
Potassium: 3.9 mmol/L (ref 3.5–5.1)
Sodium: 139 mmol/L (ref 135–145)
Total Bilirubin: 0.3 mg/dL (ref 0.3–1.2)
Total Protein: 8.1 g/dL (ref 6.5–8.1)

## 2020-09-22 MED ORDER — LEUPROLIDE ACETATE (4 MONTH) 30 MG ~~LOC~~ KIT
30.0000 mg | PACK | Freq: Once | SUBCUTANEOUS | Status: AC
  Administered 2020-09-22: 30 mg via SUBCUTANEOUS

## 2020-09-22 MED ORDER — LEUPROLIDE ACETATE (4 MONTH) 30 MG ~~LOC~~ KIT
PACK | SUBCUTANEOUS | Status: AC
  Filled 2020-09-22: qty 30

## 2020-09-22 NOTE — Progress Notes (Signed)
Hematology and Oncology Follow Up Visit  James [name] [id number] 05-22-1946 75 y.o. 09/22/2020 2:48 PM [name], MDJones, [name], MD   Principle Diagnosis: 75 year old man with castration-sensitive advanced prostate cancer with lymphadenopathy diagnosed in 2017. Marland Kitchen   Prior Therapy: He was treated with radiation therapy as a definitive modality well living in [place].  He developed a PSA recurrence in 2013 and his PSA was up to 2.9. He had a repeat biopsy at that time which showed a Gleason score 3+4 = 7. He was treated with cryoablation at that time.  In 2016 he had a elevated PSA up to 3.2 and a repeat biopsy in March 2016 showed a Gleason score 4+3 = 7 as well as a another foci of Gleason score 4+4 = 8. He underwent a repeat cryotherapy done while he was living in [place].  PSA went up to 2.96 and a PET scan obtained on 12/01/2015 showed marked hypermetabolic right pelvic sidewall lymph node consistent with metastatic disease. He underwent a biopsy on 12/15/2015 and the biopsy confirmed the presence of metastatic prostate cancer   Current therapy:  Eligard 30 mg every 4 months.  He will receive Eligard today and repeated in 4 months.  Zytiga 1000 mg daily started in July 2017.  Interim History:  James [name] is here for a follow-up visit.  Since the last visit, he reports no major changes in his health.  He denies any recent hospitalization or illnesses.  He denies any bone pain or pathological fractures.  He denies any complications related to Zytiga.  His performance status quality of life remain excellent.       Medications: Unchanged on review. Current Outpatient Medications  Medication Sig Dispense Refill  . abiraterone acetate (ZYTIGA) 250 MG tablet TAKE 4 TABLETS BY MOUTH DAILY. TAKE ON AN EMPTY STOMACH 1 HOUR BEFORE OR 2 HOURS AFTER A MEAL 120 tablet 3  . amLODipine (NORVASC) 10 MG tablet Take 10 mg by mouth daily.    Marland Kitchen atorvastatin (LIPITOR) 40 MG tablet     .  butalbital-acetaminophen-caffeine (FIORICET) 50-325-40 MG tablet Take by mouth 2 (two) times daily as needed for headache.     No current facility-administered medications for this visit.     Allergies: No Known Allergies     Physical Exam:   Blood pressure 114/76, pulse 63, temperature (!) 97 F (36.1 C), temperature source Tympanic, resp. rate 18, height 5\' 10"  (1.778 m), weight 178 lb 9.6 oz (81 kg), SpO2 100 %.   ECOG: 0   General appearance: Alert, awake without any distress. Head: Atraumatic without abnormalities Oropharynx: Without any thrush or ulcers. Eyes: No scleral icterus. Lymph nodes: No lymphadenopathy noted in the cervical, supraclavicular, or axillary nodes Heart:regular rate and rhythm, without any murmurs or gallops.   Lung: Clear to auscultation without any rhonchi, wheezes or dullness to percussion. Abdomin: Soft, nontender without any shifting dullness or ascites. Musculoskeletal: No clubbing or cyanosis. Neurological: No motor or sensory deficits. Skin: No rashes or lesions.        Lab Results: Lab Results  Component Value Date   WBC 5.1 09/22/2020   HGB 12.0 (L) 09/22/2020   HCT 35.6 (L) 09/22/2020   MCV 85.6 09/22/2020   PLT 162 09/22/2020     Chemistry      Component Value Date/Time   NA 140 03/16/2020 1300   NA 138 06/09/2019 1228   NA 140 06/05/2017 1003   K 3.8 03/16/2020 1300   K 4.0 06/05/2017  1003   CL 106 03/16/2020 1300   CO2 27 03/16/2020 1300   CO2 27 06/05/2017 1003   BUN 18 03/16/2020 1300   BUN 22 06/09/2019 1228   BUN 25.2 06/05/2017 1003   CREATININE 1.13 03/16/2020 1300   CREATININE 1.3 06/05/2017 1003      Component Value Date/Time   CALCIUM 9.9 03/16/2020 1300   CALCIUM 9.9 06/05/2017 1003   ALKPHOS 131 (H) 03/16/2020 1300   ALKPHOS 78 06/05/2017 1003   AST 29 03/16/2020 1300   AST 35 (H) 06/05/2017 1003   ALT 22 03/16/2020 1300   ALT 63 (H) 06/05/2017 1003   BILITOT 0.3 03/16/2020 1300   BILITOT  0.37 06/05/2017 1003          Results for James [name] (MRN [id number]) as of 09/22/2020 15:25  Ref. Range 03/16/2020 13:00  Prostate Specific Ag, Serum Latest Ref Range: 0.0 - 4.0 ng/mL <0.1           Impression and Plan:  75 year old man with:  1.  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    He has tolerated the current therapy without any major complaints.  Risks and benefits of continuing his treatments were reiterated.  Potential complications including hypertension, renal insufficiency and fluid retention with discussed.  Alternative options including systemic chemotherapy which will be deferred unless he has progression of disease.   2. Hypertension: No issues reported with his blood pressure at this time.  This will be monitored on Zytiga.  3. Hypokalemia: His potassium is normal today and will be monitored periodically.  4. Androgen depravation therapy: He will receive Eligard today and repeated in 4 months.  Complication clinic weight gain, hot flashes among others were reviewed.  He is agreeable to proceed.  5.  Goals of care and prognosis: Aggressive measures are warranted despite his disease being incurable.  His performance status is excellent.  6. Follow-up: He will return in 4 months for repeat follow-up.   30  minutes were dedicated to this visit.  The time was spent on reviewing disease status, discussing treatment options and outlining future plan of care.  James Button, MD 3/25/20222:48 PM

## 2020-09-23 LAB — PROSTATE-SPECIFIC AG, SERUM (LABCORP): Prostate Specific Ag, Serum: <0.1 ng/mL (ref 0.0–4.0)

## 2020-09-25 ENCOUNTER — Telehealth: Payer: Self-pay

## 2020-09-25 NOTE — Telephone Encounter (Signed)
-----   Message from Wyatt Portela, MD sent at 09/25/2020  8:26 AM EDT ----- Please let him know his PSA is still low

## 2020-09-25 NOTE — Telephone Encounter (Signed)
Called patient and made him aware of PSA result. Patient verbalized understanding.  

## 2020-09-28 ENCOUNTER — Other Ambulatory Visit (HOSPITAL_COMMUNITY): Payer: Self-pay

## 2020-12-28 ENCOUNTER — Other Ambulatory Visit: Payer: Self-pay | Admitting: *Deleted

## 2020-12-28 DIAGNOSIS — C61 Malignant neoplasm of prostate: Secondary | ICD-10-CM

## 2020-12-28 MED ORDER — ABIRATERONE ACETATE 250 MG PO TABS: ORAL_TABLET | ORAL | 3 refills | Status: DC

## 2021-01-19 ENCOUNTER — Ambulatory Visit: Payer: Medicare HMO

## 2021-01-19 ENCOUNTER — Other Ambulatory Visit: Payer: Medicare HMO

## 2021-01-19 ENCOUNTER — Ambulatory Visit: Payer: Medicare HMO | Admitting: Oncology

## 2021-01-22 ENCOUNTER — Ambulatory Visit: Payer: Medicare HMO | Admitting: Oncology

## 2021-01-22 ENCOUNTER — Other Ambulatory Visit: Payer: Medicare HMO

## 2021-01-22 ENCOUNTER — Ambulatory Visit: Payer: Medicare HMO

## 2021-01-23 ENCOUNTER — Telehealth: Payer: Self-pay | Admitting: Oncology

## 2021-01-23 NOTE — Telephone Encounter (Signed)
Scheduled appts per 7/25 sch msg. Called pt, no answer. Left msg with appts dates and times.

## 2021-02-06 ENCOUNTER — Inpatient Hospital Stay: Payer: Medicare HMO

## 2021-02-06 ENCOUNTER — Other Ambulatory Visit: Payer: Self-pay

## 2021-02-06 ENCOUNTER — Inpatient Hospital Stay: Payer: Medicare HMO | Attending: Oncology

## 2021-02-06 VITALS — BP 129/76 | HR 60 | Temp 98.8°F | Resp 18

## 2021-02-06 DIAGNOSIS — I10 Essential (primary) hypertension: Secondary | ICD-10-CM | POA: Insufficient documentation

## 2021-02-06 DIAGNOSIS — Z192 Hormone resistant malignancy status: Secondary | ICD-10-CM | POA: Diagnosis not present

## 2021-02-06 DIAGNOSIS — Z79899 Other long term (current) drug therapy: Secondary | ICD-10-CM | POA: Diagnosis not present

## 2021-02-06 DIAGNOSIS — C775 Secondary and unspecified malignant neoplasm of intrapelvic lymph nodes: Secondary | ICD-10-CM | POA: Insufficient documentation

## 2021-02-06 DIAGNOSIS — Z923 Personal history of irradiation: Secondary | ICD-10-CM | POA: Diagnosis not present

## 2021-02-06 DIAGNOSIS — C61 Malignant neoplasm of prostate: Secondary | ICD-10-CM | POA: Insufficient documentation

## 2021-02-06 DIAGNOSIS — E876 Hypokalemia: Secondary | ICD-10-CM | POA: Diagnosis not present

## 2021-02-06 DIAGNOSIS — Z79818 Long term (current) use of other agents affecting estrogen receptors and estrogen levels: Secondary | ICD-10-CM | POA: Insufficient documentation

## 2021-02-06 LAB — CBC WITH DIFFERENTIAL (CANCER CENTER ONLY)
Abs Immature Granulocytes: 0.01 10*3/uL (ref 0.00–0.07)
Basophils Absolute: 0 10*3/uL (ref 0.0–0.1)
Basophils Relative: 1 %
Eosinophils Absolute: 0.2 10*3/uL (ref 0.0–0.5)
Eosinophils Relative: 4 %
HCT: 34.1 % — ABNORMAL LOW (ref 39.0–52.0)
Hemoglobin: 11.6 g/dL — ABNORMAL LOW (ref 13.0–17.0)
Immature Granulocytes: 0 %
Lymphocytes Relative: 33 %
Lymphs Abs: 1.8 10*3/uL (ref 0.7–4.0)
MCH: 28.6 pg (ref 26.0–34.0)
MCHC: 34 g/dL (ref 30.0–36.0)
MCV: 84.2 fL (ref 80.0–100.0)
Monocytes Absolute: 0.5 10*3/uL (ref 0.1–1.0)
Monocytes Relative: 9 %
Neutro Abs: 2.9 10*3/uL (ref 1.7–7.7)
Neutrophils Relative %: 53 %
Platelet Count: 176 10*3/uL (ref 150–400)
RBC: 4.05 MIL/uL — ABNORMAL LOW (ref 4.22–5.81)
RDW: 16 % — ABNORMAL HIGH (ref 11.5–15.5)
WBC Count: 5.5 10*3/uL (ref 4.0–10.5)
nRBC: 0 % (ref 0.0–0.2)

## 2021-02-06 LAB — CMP (CANCER CENTER ONLY)
ALT: 18 U/L (ref 0–44)
AST: 25 U/L (ref 15–41)
Albumin: 3.6 g/dL (ref 3.5–5.0)
Alkaline Phosphatase: 106 U/L (ref 38–126)
Anion gap: 7 (ref 5–15)
BUN: 19 mg/dL (ref 8–23)
CO2: 23 mmol/L (ref 22–32)
Calcium: 9.9 mg/dL (ref 8.9–10.3)
Chloride: 109 mmol/L (ref 98–111)
Creatinine: 1.36 mg/dL — ABNORMAL HIGH (ref 0.61–1.24)
GFR, Estimated: 55 mL/min — ABNORMAL LOW (ref >60)
Glucose, Bld: 98 mg/dL (ref 70–99)
Potassium: 3.7 mmol/L (ref 3.5–5.1)
Sodium: 139 mmol/L (ref 135–145)
Total Bilirubin: 0.3 mg/dL (ref 0.3–1.2)
Total Protein: 7.8 g/dL (ref 6.5–8.1)

## 2021-02-06 MED ORDER — LEUPROLIDE ACETATE (4 MONTH) 30 MG ~~LOC~~ KIT
PACK | SUBCUTANEOUS | Status: AC
  Filled 2021-02-06: qty 30

## 2021-02-06 MED ORDER — LEUPROLIDE ACETATE (4 MONTH) 30 MG ~~LOC~~ KIT
30.0000 mg | PACK | Freq: Once | SUBCUTANEOUS | Status: AC
  Administered 2021-02-06: 30 mg via SUBCUTANEOUS

## 2021-02-06 NOTE — Patient Instructions (Signed)
Leuprolide depot injection What is this medication? LEUPROLIDE (loo PROE lide) is a man-made protein that acts like a natural hormone in the body. It decreases testosterone in men and decreases estrogen in women. In men, this medicine is used to treat advanced prostate cancer. In women, some forms of this medicine may be used to treat endometriosis, uterinefibroids, or other male hormone-related problems. This medicine may be used for other purposes; ask your health care provider orpharmacist if you have questions. COMMON BRAND NAME(S): Eligard, Fensolv, Lupron Depot, Lupron Depot-Ped, Viadur What should I tell my care team before I take this medication? They need to know if you have any of these conditions: diabetes heart disease or previous heart attack high blood pressure high cholesterol mental illness osteoporosis pain or difficulty passing urine seizures spinal cord metastasis stroke suicidal thoughts, plans, or attempt; a previous suicide attempt by you or a family member tobacco smoker unusual vaginal bleeding (women) an unusual or allergic reaction to leuprolide, benzyl alcohol, other medicines, foods, dyes, or preservatives pregnant or trying to get pregnant breast-feeding How should I use this medication? This medicine is for injection into a muscle or for injection under the skin. It is given by a health care professional in a hospital or clinic setting. The specific product will determine how it will be given to you. Make sure youunderstand which product you receive and how often you will receive it. Talk to your pediatrician regarding the use of this medicine in children.Special care may be needed. Overdosage: If you think you have taken too much of this medicine contact apoison control center or emergency room at once. NOTE: This medicine is only for you. Do not share this medicine with others. What if I miss a dose? It is important not to miss a dose. Call your doctor  or health careprofessional if you are unable to keep an appointment. Depot injections: Depot injections are given either once-monthly, every 12 weeks, every 16 weeks, or every 24 weeks depending on the product you are prescribed. The product you are prescribed will be based on if you are male orfemale, and your condition. Make sure you understand your product and dosing. What may interact with this medication? Do not take this medicine with any of the following medications: chasteberry cisapride dronedarone pimozide thioridazine This medicine may also interact with the following medications: herbal or dietary supplements, like black cohosh or DHEA male hormones, like estrogens or progestins and birth control pills, patches, rings, or injections male hormones, like testosterone other medicines that prolong the QT interval (abnormal heart rhythm) This list may not describe all possible interactions. Give your health care provider a list of all the medicines, herbs, non-prescription drugs, or dietary supplements you use. Also tell them if you smoke, drink alcohol, or use illegaldrugs. Some items may interact with your medicine. What should I watch for while using this medication? Visit your doctor or health care professional for regular checks on your progress. During the first weeks of treatment, your symptoms may get worse, but then will improve as you continue your treatment. You may get hot flashes, increased bone pain, increased difficulty passing urine, or an aggravation of nerve symptoms. Discuss these effects with your doctor or health careprofessional, some of them may improve with continued use of this medicine. Male patients may experience a menstrual cycle or spotting during the first months of therapy with this medicine. If this continues, contact your doctor orhealth care professional. This medicine may increase blood sugar. Ask  your healthcare provider if changesin diet or medicines  are needed if you have diabetes. What side effects may I notice from receiving this medication? Side effects that you should report to your doctor or health care professionalas soon as possible: allergic reactions like skin rash, itching or hives, swelling of the face, lips, or tongue breathing problems chest pain depression or memory disorders pain in your legs or groin pain at site where injected or implanted seizures severe headache signs and symptoms of high blood sugar such as being more thirsty or hungry or having to urinate more than normal. You may also feel very tired or have blurry vision swelling of the feet and legs suicidal thoughts or other mood changes visual changes vomiting Side effects that usually do not require medical attention (report to yourdoctor or health care professional if they continue or are bothersome): breast swelling or tenderness decrease in sex drive or performance diarrhea hot flashes loss of appetite muscle, joint, or bone pains nausea redness or irritation at site where injected or implanted skin problems or acne This list may not describe all possible side effects. Call your doctor for medical advice about side effects. You may report side effects to FDA at1-800-FDA-1088. Where should I keep my medication? This drug is given in a hospital or clinic and will not be stored at home. NOTE: This sheet is a summary. It may not cover all possible information. If you have questions about this medicine, talk to your doctor, pharmacist, orhealth care provider.  2022 Elsevier/Gold Standard (2019-05-19 10:35:13)

## 2021-02-07 LAB — PROSTATE-SPECIFIC AG, SERUM (LABCORP): Prostate Specific Ag, Serum: <0.1 ng/mL (ref 0.0–4.0)

## 2021-02-20 ENCOUNTER — Inpatient Hospital Stay (HOSPITAL_BASED_OUTPATIENT_CLINIC_OR_DEPARTMENT_OTHER): Payer: Medicare HMO | Admitting: Oncology

## 2021-02-20 ENCOUNTER — Other Ambulatory Visit: Payer: Self-pay

## 2021-02-20 VITALS — BP 125/89 | HR 85 | Temp 97.2°F | Resp 18 | Wt 182.6 lb

## 2021-02-20 DIAGNOSIS — C61 Malignant neoplasm of prostate: Secondary | ICD-10-CM

## 2021-02-20 NOTE — Progress Notes (Signed)
Hematology and Oncology Follow Up Visit  EDIZ NEYLON [id number] Jun 24, 1946 75 y.o. 02/20/2021 3:18 PM [name], MDJones, [name], MD   Principle Diagnosis: 75 year old man with advanced prostate cancer with lymphadenopathy diagnosed in 2017.  He has castration-sensitive disease.  Prior Therapy: He was treated with radiation therapy as a definitive modality well living in [place].  He developed a PSA recurrence in 2013 and his PSA was up to 2.9. He had a repeat biopsy at that time which showed a Gleason score 3+4 = 7. He was treated with cryoablation at that time.  In 2016 he had a elevated PSA up to 3.2 and a repeat biopsy in March 2016 showed a Gleason score 4+3 = 7 as well as a another foci of Gleason score 4+4 = 8. He underwent a repeat cryotherapy done while he was living in [place].  PSA went up to 2.96 and a PET scan obtained on 12/01/2015 showed marked hypermetabolic right pelvic sidewall lymph node consistent with metastatic disease. He underwent a biopsy on 12/15/2015 and the biopsy confirmed the presence of metastatic prostate cancer   Current therapy:  Eligard 30 mg every 4 months.  Next injection will be in December 2024.  Zytiga 1000 mg daily started in July 2017.  Interim History:  Mr. Diclemente is here for a follow-up evaluation.  Since the last visit, he reports no major changes in his health.  He continues to tolerate Zytiga without any major complaints.  Denies any nausea, vomiting or abdominal pain.  He denies any hospitalization or illnesses.  He denies any bone pain or pathological fractures.  He denies any urinary complaints.      Medications: Updated on review. Current Outpatient Medications  Medication Sig Dispense Refill   abiraterone acetate (ZYTIGA) 250 MG tablet TAKE 4 TABLETS BY MOUTH DAILY. TAKE ON AN EMPTY STOMACH 1 HOUR BEFORE OR 2 HOURS AFTER A MEAL 120 tablet 3   amLODipine (NORVASC) 10 MG tablet Take 10 mg by mouth daily.     atorvastatin  (LIPITOR) 40 MG tablet      butalbital-acetaminophen-caffeine (FIORICET) 50-325-40 MG tablet Take by mouth 2 (two) times daily as needed for headache.     No current facility-administered medications for this visit.     Allergies: No Known Allergies     Physical Exam:    Blood pressure 125/89, pulse 85, temperature (!) 97.2 F (36.2 C), temperature source Oral, resp. rate 18, weight 182 lb 9 oz (82.8 kg), SpO2 100 %.   ECOG: 0  General appearance: Comfortable appearing without any discomfort Head: Normocephalic without any trauma Oropharynx: Mucous membranes are moist and pink without any thrush or ulcers. Eyes: Pupils are equal and round reactive to light. Lymph nodes: No cervical, supraclavicular, inguinal or axillary lymphadenopathy.   Heart:regular rate and rhythm.  S1 and S2 without leg edema. Lung: Clear without any rhonchi or wheezes.  No dullness to percussion. Abdomin: Soft, nontender, nondistended with good bowel sounds.  No hepatosplenomegaly. Musculoskeletal: No joint deformity or effusion.  Full range of motion noted. Neurological: No deficits noted on motor, sensory and deep tendon reflex exam. Skin: No petechial rash or dryness.  Appeared moist.          Lab Results: Lab Results  Component Value Date   WBC 5.5 02/06/2021   HGB 11.6 (L) 02/06/2021   HCT 34.1 (L) 02/06/2021   MCV 84.2 02/06/2021   PLT 176 02/06/2021     Chemistry      Component  Value Date/Time   NA 139 02/06/2021 1259   NA 138 06/09/2019 1228   NA 140 06/05/2017 1003   K 3.7 02/06/2021 1259   K 4.0 06/05/2017 1003   CL 109 02/06/2021 1259   CO2 23 02/06/2021 1259   CO2 27 06/05/2017 1003   BUN 19 02/06/2021 1259   BUN 22 06/09/2019 1228   BUN 25.2 06/05/2017 1003   CREATININE 1.36 (H) 02/06/2021 1259   CREATININE 1.3 06/05/2017 1003      Component Value Date/Time   CALCIUM 9.9 02/06/2021 1259   CALCIUM 9.9 06/05/2017 1003   ALKPHOS 106 02/06/2021 1259   ALKPHOS 78  06/05/2017 1003   AST 25 02/06/2021 1259   AST 35 (H) 06/05/2017 1003   ALT 18 02/06/2021 1259   ALT 63 (H) 06/05/2017 1003   BILITOT 0.3 02/06/2021 1259   BILITOT 0.37 06/05/2017 1003            Results for RANDOLF, FEICK (MRN [id number]) as of 02/20/2021 15:20  Ref. Range 02/06/2021 12:59  Prostate Specific Ag, Serum Latest Ref Range: 0.0 - 4.0 ng/mL <0.1          Impression and Plan:  75 year old man with:   1.  Advanced prostate cancer with pelvic adenopathy diagnosed in 2017.  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  He is currently on Zytiga with excellent PSA response and disease control overall.  Risks and benefits of continuing this treatment were discussed.  Alternative treatment option would also be a repeat.  At this time, his PSA continues to be undetectable and I recommended continued Zytiga for the time being.  Alternative options such as systemic chemotherapy will be deferred unless he has disease progression in the future.   2. Hypertension: This will be monitored on Zytiga and blood pressure remains within normal range.  3. Hypokalemia: no issues reported with his last potassium on August 9.  4. Androgen depravation therapy: I recommended continuing this indefinitely with next injection scheduled in December 2022.  Complications that include weight gain, hot flashes and sexual dysfunction were reiterated.  5.  Goals of care and prognosis: Aggressive measures are warranted given his excellent performance status.  Treatment remains palliative however.  6. Follow-up: In 4 months for repeat follow-up.   30  minutes were spent on this visit.  The time was dedicated to reviewing laboratory data, disease status update, treatment options and answering questions about her prognosis.  Zola Button, MD 8/23/20223:18 PM

## 2021-04-10 ENCOUNTER — Other Ambulatory Visit: Payer: Self-pay | Admitting: *Deleted

## 2021-04-10 DIAGNOSIS — C61 Malignant neoplasm of prostate: Secondary | ICD-10-CM

## 2021-04-10 MED ORDER — ABIRATERONE ACETATE 250 MG PO TABS: ORAL_TABLET | ORAL | 3 refills | Status: DC

## 2021-04-18 ENCOUNTER — Encounter: Payer: Self-pay | Admitting: Oncology

## 2021-04-18 ENCOUNTER — Other Ambulatory Visit (HOSPITAL_COMMUNITY): Payer: Self-pay

## 2021-04-18 ENCOUNTER — Telehealth: Payer: Self-pay

## 2021-04-18 NOTE — Telephone Encounter (Signed)
Oral Oncology Patient Advocate Encounter  Was successful in securing patient a $8000 grant from [employer] to provide copayment coverage for Zytiga.  This will keep the out of pocket expense at $0.     [employer] ID: [id number]  I have spoken with the patient.   The billing information is as follows and has been shared with [place]: [id number] PCN: [id number] Member ID: [id number] Group ID: [id number] Dates of Eligibility: 03/19/21 through 03/18/22  Fund:  [place] Patient [place] Phone [phone] Fax [phone] 04/18/2021 12:09 PM

## 2021-05-10 ENCOUNTER — Encounter: Payer: Self-pay | Admitting: Pharmacist

## 2021-05-10 NOTE — Progress Notes (Signed)
Oral Oncology Pharmacist Encounter   Was successful in securing patient a $4250 grant from [place] to provide copayment coverage for Zytiga.  This will keep the out of pocket expense at $0.     The billing information is as follows and has been shared with [place].   Member ID: [id number] Group ID: [id number] RxBin: [id number] PCN: [id number] Dates of Eligibility: 05/09/2021 through 05/09/2022  Fund name:  [place].  Leron Croak, PharmD, BCPS Hematology/Oncology Clinical Pharmacist [facility] [place] [phone] 05/10/2021 9:40 AM

## 2021-05-14 ENCOUNTER — Encounter: Payer: Self-pay | Admitting: Oncology

## 2021-05-14 ENCOUNTER — Other Ambulatory Visit (HOSPITAL_COMMUNITY): Payer: Self-pay

## 2021-06-15 ENCOUNTER — Other Ambulatory Visit: Payer: Self-pay | Admitting: *Deleted

## 2021-06-15 DIAGNOSIS — C61 Malignant neoplasm of prostate: Secondary | ICD-10-CM

## 2021-06-15 MED ORDER — ABIRATERONE ACETATE 250 MG PO TABS
ORAL_TABLET | ORAL | 3 refills | Status: DC
  Filled 2021-06-15: qty 120, fill #0
  Filled 2021-06-29 – 2021-07-31 (×2): qty 120, 30d supply, fill #0
  Filled 2021-09-24: qty 120, 30d supply, fill #1
  Filled 2021-10-23: qty 120, 30d supply, fill #2
  Filled 2021-11-19: qty 120, 30d supply, fill #3

## 2021-06-18 ENCOUNTER — Other Ambulatory Visit (HOSPITAL_COMMUNITY): Payer: Self-pay

## 2021-06-18 ENCOUNTER — Telehealth: Payer: Self-pay

## 2021-06-18 NOTE — Telephone Encounter (Signed)
Oral Chemotherapy Pharmacist Encounter  Informed patient that we will be filling his Zytiga at [facility] and that we have grants to cover patients out of pocket costs. Patient states he has 14 days of medication left. Medication refill scheduled for November 28th.   Drema Halon, PharmD Hematology/Oncology Clinical Pharmacist [facility] [phone]

## 2021-06-19 ENCOUNTER — Other Ambulatory Visit (HOSPITAL_COMMUNITY): Payer: Self-pay

## 2021-06-19 ENCOUNTER — Telehealth: Payer: Self-pay

## 2021-06-19 NOTE — Telephone Encounter (Signed)
Oral Oncology Patient Advocate Encounter   Received notification from [facility] that prior authorization for James [name] is required.   PA submitted on CoverMyMeds Key [id number] Status is pending   Oral Oncology Clinic will continue to follow.  [name] Patient [place] Phone [phone] Fax [phone] 06/19/2021 2:01 PM

## 2021-06-20 ENCOUNTER — Other Ambulatory Visit (HOSPITAL_COMMUNITY): Payer: Self-pay

## 2021-06-20 NOTE — Telephone Encounter (Signed)
Oral Oncology Patient Advocate Encounter  Prior Authorization for James [name] has been approved.    PA# [id number] Effective dates: 06/20/21 through 12/16/21   Oral Oncology Clinic will continue to follow.   [place] Patient [place] Phone [phone] Fax [phone] 06/20/2021 8:57 AM

## 2021-06-22 ENCOUNTER — Other Ambulatory Visit: Payer: Self-pay

## 2021-06-22 ENCOUNTER — Inpatient Hospital Stay: Payer: Medicare HMO | Admitting: Oncology

## 2021-06-22 ENCOUNTER — Inpatient Hospital Stay: Payer: Medicare HMO | Attending: Oncology

## 2021-06-22 ENCOUNTER — Inpatient Hospital Stay: Payer: Medicare HMO

## 2021-06-22 VITALS — BP 122/73 | HR 80 | Temp 98.0°F | Resp 20 | Wt 193.5 lb

## 2021-06-22 DIAGNOSIS — C61 Malignant neoplasm of prostate: Secondary | ICD-10-CM

## 2021-06-22 LAB — CBC WITH DIFFERENTIAL (CANCER CENTER ONLY)
Abs Immature Granulocytes: 0.02 10*3/uL (ref 0.00–0.07)
Basophils Absolute: 0 10*3/uL (ref 0.0–0.1)
Basophils Relative: 1 %
Eosinophils Absolute: 0.2 10*3/uL (ref 0.0–0.5)
Eosinophils Relative: 3 %
HCT: 33.4 % — ABNORMAL LOW (ref 39.0–52.0)
Hemoglobin: 11.2 g/dL — ABNORMAL LOW (ref 13.0–17.0)
Immature Granulocytes: 0 %
Lymphocytes Relative: 27 %
Lymphs Abs: 1.8 10*3/uL (ref 0.7–4.0)
MCH: 26.8 pg (ref 26.0–34.0)
MCHC: 33.5 g/dL (ref 30.0–36.0)
MCV: 79.9 fL — ABNORMAL LOW (ref 80.0–100.0)
Monocytes Absolute: 0.7 10*3/uL (ref 0.1–1.0)
Monocytes Relative: 11 %
Neutro Abs: 3.9 10*3/uL (ref 1.7–7.7)
Neutrophils Relative %: 58 %
Platelet Count: 166 10*3/uL (ref 150–400)
RBC: 4.18 MIL/uL — ABNORMAL LOW (ref 4.22–5.81)
RDW: 16.7 % — ABNORMAL HIGH (ref 11.5–15.5)
WBC Count: 6.7 10*3/uL (ref 4.0–10.5)
nRBC: 0 % (ref 0.0–0.2)

## 2021-06-22 LAB — CMP (CANCER CENTER ONLY)
ALT: 19 U/L (ref 0–44)
AST: 25 U/L (ref 15–41)
Albumin: 4 g/dL (ref 3.5–5.0)
Alkaline Phosphatase: 110 U/L (ref 38–126)
Anion gap: 9 (ref 5–15)
BUN: 20 mg/dL (ref 8–23)
CO2: 21 mmol/L — ABNORMAL LOW (ref 22–32)
Calcium: 9.9 mg/dL (ref 8.9–10.3)
Chloride: 108 mmol/L (ref 98–111)
Creatinine: 1.1 mg/dL (ref 0.61–1.24)
GFR, Estimated: >60 mL/min (ref >60)
Glucose, Bld: 93 mg/dL (ref 70–99)
Potassium: 3.6 mmol/L (ref 3.5–5.1)
Sodium: 138 mmol/L (ref 135–145)
Total Bilirubin: 0.5 mg/dL (ref 0.3–1.2)
Total Protein: 8 g/dL (ref 6.5–8.1)

## 2021-06-22 MED ORDER — LEUPROLIDE ACETATE (4 MONTH) 30 MG ~~LOC~~ KIT
30.0000 mg | PACK | Freq: Once | SUBCUTANEOUS | Status: AC
  Administered 2021-06-22: 10:00:00 30 mg via SUBCUTANEOUS
  Filled 2021-06-22: qty 30

## 2021-06-22 NOTE — Progress Notes (Signed)
Hematology and Oncology Follow Up Visit  James [name] [id number] 09/04/1945 75 y.o. 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    Prior Therapy: He was treated with radiation therapy as a definitive modality well living in [place].  He developed a PSA recurrence in 2013 and his PSA was up to 2.9. He had a repeat biopsy at that time which showed a Gleason score 3+4 = 7. He was treated with cryoablation at that time.  In 2016 he had a elevated PSA up to 3.2 and a repeat biopsy in March 2016 showed a Gleason score 4+3 = 7 as well as a another foci of Gleason score 4+4 = 8. He underwent a repeat cryotherapy done while he was living in [place].  PSA went up to 2.96 and a PET scan obtained on 12/01/2015 showed marked hypermetabolic right pelvic sidewall lymph node consistent with metastatic disease. He underwent a biopsy on 12/15/2015 and the biopsy confirmed the presence of metastatic prostate cancer   Current therapy:  Eligard 30 mg every 4 months.  He will receive Eligard injection today.  Zytiga 1000 mg daily started in July 2017.  Interim History:  [name] presents today for a follow-up visit.  Since the last visit, he reports no major changes in his health.  He continues to tolerate Zytiga without any complaints.  He denies any recent hospitalizations or illnesses.  He denies any excessive fatigue or tiredness.  His performance status quality of life remained excellent.       Medications: Reviewed without changes. Current Outpatient Medications  Medication Sig Dispense Refill   abiraterone acetate (ZYTIGA) 250 MG tablet TAKE 4 TABLETS BY MOUTH DAILY. TAKE ON AN EMPTY STOMACH 1 HOUR BEFORE OR 2 HOURS AFTER A MEAL 120 tablet 3   amLODipine (NORVASC) 10 MG tablet Take 10 mg by mouth daily.     atorvastatin (LIPITOR) 40 MG tablet       butalbital-acetaminophen-caffeine (FIORICET) 50-325-40 MG tablet Take by mouth 2 (two) times daily as needed for headache.     No current facility-administered medications for this visit.     Allergies: No Known Allergies     Physical Exam:    Blood pressure 122/73, pulse 80, temperature 98 F (36.7 C), resp. rate 20, weight 193 lb 8 oz (87.8 kg), SpO2 100 %.    ECOG: 0    General appearance: Alert, awake without any distress. Head: Atraumatic without abnormalities Oropharynx: Without any thrush or ulcers. Eyes: No scleral icterus. Lymph nodes: No lymphadenopathy noted in the cervical, supraclavicular, or axillary nodes Heart:regular rate and rhythm, without any murmurs or gallops.   Lung: Clear to auscultation without any rhonchi, wheezes or dullness to percussion. Abdomin: Soft, nontender without any shifting dullness or ascites. Musculoskeletal: No clubbing or cyanosis. Neurological: No motor or sensory deficits. Skin: No rashes or lesions.          Lab Results: Lab Results  Component Value Date   WBC 5.5 02/06/2021   HGB 11.6 (L) 02/06/2021   HCT 34.1 (L) 02/06/2021   MCV 84.2 02/06/2021   PLT 176 02/06/2021     Chemistry      Component Value Date/Time   NA 139 02/06/2021 1259   NA 138 06/09/2019 1228   NA 140 06/05/2017 1003   K 3.7 02/06/2021 1259   K 4.0 06/05/2017 1003   CL 109 02/06/2021 1259  CO2 23 02/06/2021 1259   CO2 27 06/05/2017 1003   BUN 19 02/06/2021 1259   BUN 22 06/09/2019 1228   BUN 25.2 06/05/2017 1003   CREATININE 1.36 (H) 02/06/2021 1259   CREATININE 1.3 06/05/2017 1003      Component Value Date/Time   CALCIUM 9.9 02/06/2021 1259   CALCIUM 9.9 06/05/2017 1003   ALKPHOS 106 02/06/2021 1259   ALKPHOS 78 06/05/2017 1003   AST 25 02/06/2021 1259   AST 35 (H) 06/05/2017 1003   ALT 18 02/06/2021 1259   ALT 63 (H) 06/05/2017 1003   BILITOT 0.3 02/06/2021 1259   BILITOT 0.37 06/05/2017 1003            Latest  Reference Range & Units 09/22/20 14:32 02/06/21 12:59  Prostate Specific Ag, Serum 0.0 - 4.0 ng/mL <0.1 <0.1          Impression and Plan:  75 year old man with:   1.  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    He continues to have excellent PSA response that is undetectable with Zytiga.  Risks and benefits of continuing this treatment were discussed at this time.  Complication clinic hypertension, weight gain among others were reiterated.  Alternative treatment options including systemic chemotherapy were reiterated.  He is agreeable to continue at this time.  2. Hypertension: His blood pressure has been reasonably controlled without any exacerbation with Zytiga.  3. Hypokalemia: His potassium has been within normal range and will monitor on Zytiga.  4. Androgen depravation therapy: He received Eligard today and repeated in 4 months.  Complication clinic weight gain, hot flashes among others were reiterated.  He is agreeable to proceed.   5.  Goals of care and prognosis: This treatment although palliative aggressive measures are warranted.  He had an excellent response to treatment and performance status remain excellent.  6. Follow-up: He will return in 4 months for a follow-up evaluation.  30  minutes were dedicated to this encounter.  The time was spent on reviewing disease status, treatment choices and future plan of care review.   Zola Button, MD 12/23/20228:49 AM

## 2021-06-23 LAB — PROSTATE-SPECIFIC AG, SERUM (LABCORP): Prostate Specific Ag, Serum: <0.1 ng/mL (ref 0.0–4.0)

## 2021-06-26 ENCOUNTER — Telehealth: Payer: Self-pay | Admitting: *Deleted

## 2021-06-26 NOTE — Telephone Encounter (Signed)
PC to patient, informed him of Dr. Hazeline Junker message below.  He verbalizes understanding.

## 2021-06-26 NOTE — Telephone Encounter (Signed)
-----   Message from Wyatt Portela, MD sent at 06/26/2021  8:38 AM EST ----- Please let him know his PSA is low

## 2021-06-29 ENCOUNTER — Other Ambulatory Visit (HOSPITAL_COMMUNITY): Payer: Self-pay

## 2021-07-12 ENCOUNTER — Other Ambulatory Visit (HOSPITAL_COMMUNITY): Payer: Self-pay

## 2021-07-20 ENCOUNTER — Encounter: Payer: Self-pay | Admitting: Oncology

## 2021-07-31 ENCOUNTER — Other Ambulatory Visit (HOSPITAL_COMMUNITY): Payer: Self-pay

## 2021-07-31 ENCOUNTER — Encounter: Payer: Self-pay | Admitting: Oncology

## 2021-08-01 ENCOUNTER — Other Ambulatory Visit (HOSPITAL_COMMUNITY): Payer: Self-pay

## 2021-08-03 ENCOUNTER — Other Ambulatory Visit (HOSPITAL_COMMUNITY): Payer: Self-pay

## 2021-08-23 ENCOUNTER — Other Ambulatory Visit (HOSPITAL_COMMUNITY): Payer: Self-pay

## 2021-08-23 ENCOUNTER — Encounter: Payer: Self-pay | Admitting: Oncology

## 2021-09-20 ENCOUNTER — Other Ambulatory Visit (HOSPITAL_COMMUNITY): Payer: Self-pay

## 2021-09-24 ENCOUNTER — Other Ambulatory Visit (HOSPITAL_COMMUNITY): Payer: Self-pay

## 2021-09-28 ENCOUNTER — Other Ambulatory Visit (HOSPITAL_COMMUNITY): Payer: Self-pay

## 2021-10-18 ENCOUNTER — Telehealth: Payer: Self-pay | Admitting: Oncology

## 2021-10-18 NOTE — Telephone Encounter (Signed)
Called patient regarding upcoming appointment, left a voicemail. 

## 2021-10-19 ENCOUNTER — Inpatient Hospital Stay: Payer: Medicare HMO | Attending: Oncology

## 2021-10-19 ENCOUNTER — Inpatient Hospital Stay: Payer: Medicare HMO

## 2021-10-19 ENCOUNTER — Other Ambulatory Visit: Payer: Self-pay

## 2021-10-19 ENCOUNTER — Inpatient Hospital Stay (HOSPITAL_BASED_OUTPATIENT_CLINIC_OR_DEPARTMENT_OTHER): Payer: Medicare HMO | Admitting: Oncology

## 2021-10-19 VITALS — BP 112/68 | HR 89 | Temp 97.9°F | Resp 17 | Ht 70.0 in | Wt 196.8 lb

## 2021-10-19 DIAGNOSIS — Z79818 Long term (current) use of other agents affecting estrogen receptors and estrogen levels: Secondary | ICD-10-CM | POA: Insufficient documentation

## 2021-10-19 DIAGNOSIS — Z79899 Other long term (current) drug therapy: Secondary | ICD-10-CM | POA: Insufficient documentation

## 2021-10-19 DIAGNOSIS — Z191 Hormone sensitive malignancy status: Secondary | ICD-10-CM | POA: Diagnosis not present

## 2021-10-19 DIAGNOSIS — C61 Malignant neoplasm of prostate: Secondary | ICD-10-CM

## 2021-10-19 DIAGNOSIS — I10 Essential (primary) hypertension: Secondary | ICD-10-CM | POA: Insufficient documentation

## 2021-10-19 DIAGNOSIS — D649 Anemia, unspecified: Secondary | ICD-10-CM

## 2021-10-19 DIAGNOSIS — Z923 Personal history of irradiation: Secondary | ICD-10-CM | POA: Insufficient documentation

## 2021-10-19 DIAGNOSIS — E876 Hypokalemia: Secondary | ICD-10-CM | POA: Diagnosis not present

## 2021-10-19 LAB — CMP (CANCER CENTER ONLY)
ALT: 36 U/L (ref 0–44)
AST: 27 U/L (ref 15–41)
Albumin: 3.7 g/dL (ref 3.5–5.0)
Alkaline Phosphatase: 112 U/L (ref 38–126)
Anion gap: 7 (ref 5–15)
BUN: 18 mg/dL (ref 8–23)
CO2: 22 mmol/L (ref 22–32)
Calcium: 9.5 mg/dL (ref 8.9–10.3)
Chloride: 109 mmol/L (ref 98–111)
Creatinine: 1.21 mg/dL (ref 0.61–1.24)
GFR, Estimated: >60 mL/min (ref >60)
Glucose, Bld: 122 mg/dL — ABNORMAL HIGH (ref 70–99)
Potassium: 3.9 mmol/L (ref 3.5–5.1)
Sodium: 138 mmol/L (ref 135–145)
Total Bilirubin: 0.5 mg/dL (ref 0.3–1.2)
Total Protein: 7.8 g/dL (ref 6.5–8.1)

## 2021-10-19 LAB — CBC WITH DIFFERENTIAL (CANCER CENTER ONLY)
Abs Immature Granulocytes: 0.01 10*3/uL (ref 0.00–0.07)
Basophils Absolute: 0 10*3/uL (ref 0.0–0.1)
Basophils Relative: 1 %
Eosinophils Absolute: 0.4 10*3/uL (ref 0.0–0.5)
Eosinophils Relative: 6 %
HCT: 31.8 % — ABNORMAL LOW (ref 39.0–52.0)
Hemoglobin: 10.2 g/dL — ABNORMAL LOW (ref 13.0–17.0)
Immature Granulocytes: 0 %
Lymphocytes Relative: 30 %
Lymphs Abs: 1.8 10*3/uL (ref 0.7–4.0)
MCH: 24.4 pg — ABNORMAL LOW (ref 26.0–34.0)
MCHC: 32.1 g/dL (ref 30.0–36.0)
MCV: 76.1 fL — ABNORMAL LOW (ref 80.0–100.0)
Monocytes Absolute: 0.7 10*3/uL (ref 0.1–1.0)
Monocytes Relative: 12 %
Neutro Abs: 3.1 10*3/uL (ref 1.7–7.7)
Neutrophils Relative %: 51 %
Platelet Count: 195 10*3/uL (ref 150–400)
RBC: 4.18 MIL/uL — ABNORMAL LOW (ref 4.22–5.81)
RDW: 18.4 % — ABNORMAL HIGH (ref 11.5–15.5)
WBC Count: 6.1 10*3/uL (ref 4.0–10.5)
nRBC: 0 % (ref 0.0–0.2)

## 2021-10-19 MED ORDER — LEUPROLIDE ACETATE (4 MONTH) 30 MG ~~LOC~~ KIT
30.0000 mg | PACK | Freq: Once | SUBCUTANEOUS | Status: AC
  Administered 2021-10-19: 30 mg via SUBCUTANEOUS
  Filled 2021-10-19: qty 30

## 2021-10-19 NOTE — Progress Notes (Signed)
Hematology and Oncology Follow Up Visit ? ?James [name] ?[id number] ?Nov 15, 1945 76 y.o. ?10/19/2021 10:10 AM ?[name], MDJones, [name], MD  ? ?Principle Diagnosis: 76 year old man with advanced prostate cancer with lymphadenopathy diagnosed in 2017.  He has castration-sensitive at this time. ? ?Prior Therapy: ?He was treated with radiation therapy as a definitive modality well living in [place].  ?He developed a PSA recurrence in 2013 and his PSA was up to 2.9. He had a repeat biopsy at that time which showed a Gleason score 3+4 = 7. He was treated with cryoablation at that time.  ?In 2016 he had a elevated PSA up to 3.2 and a repeat biopsy in March 2016 showed a Gleason score 4+3 = 7 as well as a another foci of Gleason score 4+4 = 8. He underwent a repeat cryotherapy done while he was living in [place].  ?PSA went up to 2.96 and a PET scan obtained on 12/01/2015 showed marked hypermetabolic right pelvic sidewall lymph node consistent with metastatic disease. He underwent a biopsy on 12/15/2015 and the biopsy confirmed the presence of metastatic prostate cancer ? ? ?Current therapy:  ?Eligard 30 mg every 4 months.  He will receive that today and repeated in 4 months. ? ?Zytiga 1000 mg daily started in July 2017. ? ?Interim History:  [name] returns today for repeat evaluation.  Since last visit, he reports of feeling well without any major complaints.  He continues to tolerate Zytiga without any issues.  He denies any nausea, vomiting or abdominal pain.  He denies any hospitalizations or illnesses.  He denies any bone pain or pathological fractures.  He denies any hematochezia, melena or hemoptysis.  He continues to be active and attends to activities of daily living. ?  ? ? ? ? ?Medications: Updated on review. ?Current Outpatient Medications  ?Medication Sig Dispense Refill  ? abiraterone acetate (ZYTIGA) 250 MG tablet TAKE 4 TABLETS BY MOUTH DAILY. TAKE ON AN EMPTY STOMACH 1 HOUR BEFORE OR 2 HOURS  AFTER A MEAL 120 tablet 3  ? amLODipine (NORVASC) 10 MG tablet Take 10 mg by mouth daily.    ? atorvastatin (LIPITOR) 40 MG tablet     ? butalbital-acetaminophen-caffeine (FIORICET) 50-325-40 MG tablet Take by mouth 2 (two) times daily as needed for headache.    ? ?No current facility-administered medications for this visit.  ? ? ? ?Allergies: No Known Allergies ? ? ? ? ?Physical Exam: ? ? ?Blood pressure 112/68, pulse 89, temperature 97.9 ?F (36.6 ?C), temperature source Temporal, resp. rate 17, height '5\' 10"'$  (1.778 m), weight 196 lb 12.8 oz (89.3 kg), SpO2 100 %. ? ? ? ? ? ?ECOG: 0 ? ? ?General appearance: Comfortable appearing without any discomfort ?Head: Normocephalic without any trauma ?Oropharynx: Mucous membranes are moist and pink without any thrush or ulcers. ?Eyes: Pupils are equal and round reactive to light. ?Lymph nodes: No cervical, supraclavicular, inguinal or axillary lymphadenopathy.   ?Heart:regular rate and rhythm.  S1 and S2 without leg edema. ?Lung: Clear without any rhonchi or wheezes.  No dullness to percussion. ?Abdomin: Soft, nontender, nondistended with good bowel sounds.  No hepatosplenomegaly. ?Musculoskeletal: No joint deformity or effusion.  Full range of motion noted. ?Neurological: No deficits noted on motor, sensory and deep tendon reflex exam. ?Skin: No petechial rash or dryness.  Appeared moist.  ? ? ? ? ? ? ? ? ? ?Lab Results: ?Lab Results  ?Component Value Date  ? WBC 6.1 10/19/2021  ? HGB 10.2 (L)  10/19/2021  ? HCT 31.8 (L) 10/19/2021  ? MCV 76.1 (L) 10/19/2021  ? PLT 195 10/19/2021  ? ?  Chemistry   ?   ?Component Value Date/Time  ? NA 138 06/22/2021 0901  ? NA 138 06/09/2019 1228  ? NA 140 06/05/2017 1003  ? K 3.6 06/22/2021 0901  ? K 4.0 06/05/2017 1003  ? CL 108 06/22/2021 0901  ? CO2 21 (L) 06/22/2021 0901  ? CO2 27 06/05/2017 1003  ? BUN 20 06/22/2021 0901  ? BUN 22 06/09/2019 1228  ? BUN 25.2 06/05/2017 1003  ? CREATININE 1.10 06/22/2021 0901  ? CREATININE 1.3 06/05/2017  1003  ?    ?Component Value Date/Time  ? CALCIUM 9.9 06/22/2021 0901  ? CALCIUM 9.9 06/05/2017 1003  ? ALKPHOS 110 06/22/2021 0901  ? ALKPHOS 78 06/05/2017 1003  ? AST 25 06/22/2021 0901  ? AST 35 (H) 06/05/2017 1003  ? ALT 19 06/22/2021 0901  ? ALT 63 (H) 06/05/2017 1003  ? BILITOT 0.5 06/22/2021 0901  ? BILITOT 0.37 06/05/2017 1003  ?  ? ? ? ? ? ? ? ? ? ? ? ? ? ? ?Impression and Plan: ? ?76 year old man with: ?  ?1.  Advanced prostate cancer with disease to the lymph nodes diagnosed in 2017.  He has castration-sensitive disease. ? ?The natural course of this disease was reviewed at this time and treatment choices were reiterated.  His PSA continues to be undetectable as of December 2022.  Risks and benefits of continuing Zytiga were reviewed at this time.  He is agreeable to continue.  Alternative treatment options such as systemic chemotherapy among others were reiterated today. ? ?2. Hypertension: His blood pressure has been reasonably controlled without any exacerbation with Zytiga. ? ?3. Hypokalemia: His potassium has been within normal range and will monitor on Zytiga. ? ?4. Androgen depravation therapy: He received Eligard today and repeated in 4 months.  Complication clinic weight gain, hot flashes among others were reiterated.  He is agreeable to proceed. ? ? ?5.  Goals of care and prognosis: This treatment although palliative aggressive measures are warranted.  He had an excellent response to treatment and performance status remain excellent. ? ?6.  Microcytosis and mild anemia: Unclear etiology and could be developing mild iron deficiency.  He is unsure about his previous colon cancer screening history.  He does recall having a colonoscopy.  I have recommended evaluation with Hemoccult testing and possible colonoscopy for screening purposes.  I will obtain iron studies with the next visit. ? ?7. Follow-up: He will return in 4 months for a follow-up evaluation. ? ?30  minutes were dedicated to this  encounter.  The time was spent on reviewing disease status, treatment choices and future plan of care review. ? ? ?Zola Button, MD ?4/21/202310:10 AM ?

## 2021-10-20 LAB — PROSTATE-SPECIFIC AG, SERUM (LABCORP): Prostate Specific Ag, Serum: <0.1 ng/mL (ref 0.0–4.0)

## 2021-10-22 ENCOUNTER — Telehealth: Payer: Self-pay

## 2021-10-22 NOTE — Telephone Encounter (Signed)
-----   Message from Wyatt Portela, MD sent at 10/22/2021  9:19 AM EDT ----- ?Please let him know his PSA is still low ?

## 2021-10-22 NOTE — Telephone Encounter (Signed)
Pt advised with VU 

## 2021-10-23 ENCOUNTER — Other Ambulatory Visit (HOSPITAL_COMMUNITY): Payer: Self-pay

## 2021-10-25 ENCOUNTER — Other Ambulatory Visit (HOSPITAL_COMMUNITY): Payer: Self-pay

## 2021-11-19 ENCOUNTER — Other Ambulatory Visit (HOSPITAL_COMMUNITY): Payer: Self-pay

## 2021-11-27 ENCOUNTER — Other Ambulatory Visit (HOSPITAL_COMMUNITY): Payer: Self-pay

## 2021-12-14 ENCOUNTER — Other Ambulatory Visit (HOSPITAL_COMMUNITY): Payer: Self-pay

## 2021-12-14 ENCOUNTER — Other Ambulatory Visit: Payer: Self-pay | Admitting: Oncology

## 2021-12-14 DIAGNOSIS — C61 Malignant neoplasm of prostate: Secondary | ICD-10-CM

## 2021-12-14 MED ORDER — ABIRATERONE ACETATE 250 MG PO TABS
ORAL_TABLET | ORAL | 3 refills | Status: DC
  Filled 2021-12-14: qty 120, 30d supply, fill #0
  Filled 2022-01-09: qty 120, 30d supply, fill #1
  Filled 2022-02-06: qty 120, 30d supply, fill #2
  Filled 2022-02-28: qty 120, 30d supply, fill #3

## 2021-12-17 ENCOUNTER — Telehealth: Payer: Self-pay | Admitting: Oncology

## 2021-12-17 NOTE — Telephone Encounter (Signed)
Called patient regarding upcoming August appointment, patient has been called and notified.  

## 2021-12-20 ENCOUNTER — Other Ambulatory Visit (HOSPITAL_COMMUNITY): Payer: Self-pay

## 2021-12-24 ENCOUNTER — Telehealth: Payer: Self-pay | Admitting: Oncology

## 2022-01-09 ENCOUNTER — Other Ambulatory Visit (HOSPITAL_COMMUNITY): Payer: Self-pay

## 2022-01-14 ENCOUNTER — Other Ambulatory Visit (HOSPITAL_COMMUNITY): Payer: Self-pay

## 2022-01-15 ENCOUNTER — Other Ambulatory Visit (HOSPITAL_COMMUNITY): Payer: Self-pay

## 2022-02-04 ENCOUNTER — Other Ambulatory Visit (HOSPITAL_COMMUNITY): Payer: Self-pay

## 2022-02-06 ENCOUNTER — Other Ambulatory Visit (HOSPITAL_COMMUNITY): Payer: Self-pay

## 2022-02-11 ENCOUNTER — Other Ambulatory Visit (HOSPITAL_COMMUNITY): Payer: Self-pay

## 2022-02-15 ENCOUNTER — Inpatient Hospital Stay: Payer: Medicare HMO

## 2022-02-15 ENCOUNTER — Inpatient Hospital Stay: Payer: Medicare HMO | Admitting: Oncology

## 2022-02-28 ENCOUNTER — Other Ambulatory Visit (HOSPITAL_COMMUNITY): Payer: Self-pay

## 2022-03-12 ENCOUNTER — Other Ambulatory Visit (HOSPITAL_COMMUNITY): Payer: Self-pay

## 2022-03-14 ENCOUNTER — Inpatient Hospital Stay: Payer: Medicare HMO

## 2022-03-14 ENCOUNTER — Inpatient Hospital Stay: Payer: Medicare HMO | Admitting: Oncology

## 2022-03-14 ENCOUNTER — Inpatient Hospital Stay: Payer: Medicare HMO | Attending: Oncology

## 2022-03-14 VITALS — BP 138/83 | HR 82 | Temp 97.5°F | Resp 14 | Ht 70.0 in | Wt 197.3 lb

## 2022-03-14 DIAGNOSIS — I10 Essential (primary) hypertension: Secondary | ICD-10-CM | POA: Insufficient documentation

## 2022-03-14 DIAGNOSIS — E2740 Unspecified adrenocortical insufficiency: Secondary | ICD-10-CM | POA: Insufficient documentation

## 2022-03-14 DIAGNOSIS — Z191 Hormone sensitive malignancy status: Secondary | ICD-10-CM | POA: Diagnosis not present

## 2022-03-14 DIAGNOSIS — Z923 Personal history of irradiation: Secondary | ICD-10-CM | POA: Diagnosis not present

## 2022-03-14 DIAGNOSIS — C61 Malignant neoplasm of prostate: Secondary | ICD-10-CM

## 2022-03-14 DIAGNOSIS — D509 Iron deficiency anemia, unspecified: Secondary | ICD-10-CM | POA: Insufficient documentation

## 2022-03-14 DIAGNOSIS — E876 Hypokalemia: Secondary | ICD-10-CM | POA: Insufficient documentation

## 2022-03-14 DIAGNOSIS — R5383 Other fatigue: Secondary | ICD-10-CM | POA: Diagnosis not present

## 2022-03-14 DIAGNOSIS — Z79818 Long term (current) use of other agents affecting estrogen receptors and estrogen levels: Secondary | ICD-10-CM | POA: Insufficient documentation

## 2022-03-14 DIAGNOSIS — D649 Anemia, unspecified: Secondary | ICD-10-CM

## 2022-03-14 DIAGNOSIS — Z79899 Other long term (current) drug therapy: Secondary | ICD-10-CM | POA: Insufficient documentation

## 2022-03-14 LAB — CBC WITH DIFFERENTIAL (CANCER CENTER ONLY)
Abs Immature Granulocytes: 0.01 10*3/uL (ref 0.00–0.07)
Basophils Absolute: 0 10*3/uL (ref 0.0–0.1)
Basophils Relative: 0 %
Eosinophils Absolute: 0.2 10*3/uL (ref 0.0–0.5)
Eosinophils Relative: 3 %
HCT: 29.5 % — ABNORMAL LOW (ref 39.0–52.0)
Hemoglobin: 9.4 g/dL — ABNORMAL LOW (ref 13.0–17.0)
Immature Granulocytes: 0 %
Lymphocytes Relative: 38 %
Lymphs Abs: 1.9 10*3/uL (ref 0.7–4.0)
MCH: 22.7 pg — ABNORMAL LOW (ref 26.0–34.0)
MCHC: 31.9 g/dL (ref 30.0–36.0)
MCV: 71.1 fL — ABNORMAL LOW (ref 80.0–100.0)
Monocytes Absolute: 0.6 10*3/uL (ref 0.1–1.0)
Monocytes Relative: 12 %
Neutro Abs: 2.4 10*3/uL (ref 1.7–7.7)
Neutrophils Relative %: 47 %
Platelet Count: 197 10*3/uL (ref 150–400)
RBC: 4.15 MIL/uL — ABNORMAL LOW (ref 4.22–5.81)
RDW: 19.3 % — ABNORMAL HIGH (ref 11.5–15.5)
WBC Count: 5.1 10*3/uL (ref 4.0–10.5)
nRBC: 0 % (ref 0.0–0.2)

## 2022-03-14 LAB — CMP (CANCER CENTER ONLY)
ALT: 21 U/L (ref 0–44)
AST: 25 U/L (ref 15–41)
Albumin: 4 g/dL (ref 3.5–5.0)
Alkaline Phosphatase: 117 U/L (ref 38–126)
Anion gap: 5 (ref 5–15)
BUN: 18 mg/dL (ref 8–23)
CO2: 24 mmol/L (ref 22–32)
Calcium: 9.7 mg/dL (ref 8.9–10.3)
Chloride: 109 mmol/L (ref 98–111)
Creatinine: 1.1 mg/dL (ref 0.61–1.24)
GFR, Estimated: >60 mL/min (ref >60)
Glucose, Bld: 106 mg/dL — ABNORMAL HIGH (ref 70–99)
Potassium: 3.5 mmol/L (ref 3.5–5.1)
Sodium: 138 mmol/L (ref 135–145)
Total Bilirubin: 0.3 mg/dL (ref 0.3–1.2)
Total Protein: 7.8 g/dL (ref 6.5–8.1)

## 2022-03-14 LAB — IRON AND IRON BINDING CAPACITY (CC-WL,HP ONLY)
Iron: 6 ug/dL — ABNORMAL LOW (ref 45–182)
Saturation Ratios: 1 % — ABNORMAL LOW (ref 17.9–39.5)
TIBC: 483 ug/dL — ABNORMAL HIGH (ref 250–450)
UIBC: 477 ug/dL — ABNORMAL HIGH (ref 117–376)

## 2022-03-14 LAB — FERRITIN: Ferritin: 5 ng/mL — ABNORMAL LOW (ref 24–336)

## 2022-03-14 MED ORDER — FERROUS SULFATE 325 (65 FE) MG PO TBEC: 325.0000 mg | DELAYED_RELEASE_TABLET | Freq: Two times a day (BID) | ORAL | 3 refills | Status: AC

## 2022-03-14 MED ORDER — LEUPROLIDE ACETATE (4 MONTH) 30 MG ~~LOC~~ KIT
30.0000 mg | PACK | Freq: Once | SUBCUTANEOUS | Status: AC
  Administered 2022-03-14: 30 mg via SUBCUTANEOUS
  Filled 2022-03-14: qty 30

## 2022-03-14 NOTE — Progress Notes (Signed)
Hematology and Oncology Follow Up Visit  James [name] [id number] 1945/07/04 76 y.o. 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   Prior Therapy: He was treated with radiation therapy as a definitive modality well living in [place].  He developed a PSA recurrence in 2013 and his PSA was up to 2.9. He had a repeat biopsy at that time which showed a Gleason score 3+4 = 7. He was treated with cryoablation at that time.  In 2016 he had a elevated PSA up to 3.2 and a repeat biopsy in March 2016 showed a Gleason score 4+3 = 7 as well as a another foci of Gleason score 4+4 = 8. He underwent a repeat cryotherapy done while he was living in [place].  PSA went up to 2.96 and a PET scan obtained on 12/01/2015 showed marked hypermetabolic right pelvic sidewall lymph node consistent with metastatic disease. He underwent a biopsy on 12/15/2015 and the biopsy confirmed the presence of metastatic prostate cancer   Current therapy:  Eligard 30 mg every 4 months.  This will be given today and repeated in 4 months.  Zytiga 1000 mg daily started in July 2017.  Interim History:  James [name] is here for a follow-up visit.  Since last visit, he reports no major changes in his health.  He denies any nausea, vomiting or abdominal pain.  He denies any early satiety or bone pain.  He denies any complications related to Zytiga.  He denies any hospitalizations or illnesses.  He denies any excessive fatigue, tiredness.  He denies any hematochezia, melena or hemoptysis.       Medications: Reviewed without changes. Current Outpatient Medications  Medication Sig Dispense Refill   abiraterone acetate (ZYTIGA) 250 MG tablet TAKE 4 TABLETS BY MOUTH DAILY. TAKE ON AN EMPTY STOMACH 1 HOUR BEFORE OR 2 HOURS AFTER A MEAL 120 tablet 3   amLODipine (NORVASC) 10 MG tablet Take 10 mg by  mouth daily.     atorvastatin (LIPITOR) 40 MG tablet      butalbital-acetaminophen-caffeine (FIORICET) 50-325-40 MG tablet Take by mouth 2 (two) times daily as needed for headache.     No current facility-administered medications for this visit.     Allergies: No Known Allergies     Physical Exam:      Blood pressure 138/83, pulse 82, temperature (!) 97.5 F (36.4 C), temperature source Temporal, resp. rate 14, height '5\' 10"'$  (1.778 m), weight 197 lb 4.8 oz (89.5 kg), SpO2 100 %.    ECOG: 0   General appearance: Alert, awake without any distress. Head: Atraumatic without abnormalities Oropharynx: Without any thrush or ulcers. Eyes: No scleral icterus. Lymph nodes: No lymphadenopathy noted in the cervical, supraclavicular, or axillary nodes Heart:regular rate and rhythm, without any murmurs or gallops.   Lung: Clear to auscultation without any rhonchi, wheezes or dullness to percussion. Abdomin: Soft, nontender without any shifting dullness or ascites. Musculoskeletal: No clubbing or cyanosis. Neurological: No motor or sensory deficits. Skin: No rashes or lesions.          Lab Results: Lab Results  Component Value Date   WBC 5.1 03/14/2022   HGB 9.4 (L) 03/14/2022   HCT 29.5 (L) 03/14/2022   MCV 71.1 (L) 03/14/2022   PLT 197 03/14/2022     Chemistry      Component Value Date/Time   NA 138 10/19/2021 0959   NA 138  06/09/2019 1228   NA 140 06/05/2017 1003   K 3.9 10/19/2021 0959   K 4.0 06/05/2017 1003   CL 109 10/19/2021 0959   CO2 22 10/19/2021 0959   CO2 27 06/05/2017 1003   BUN 18 10/19/2021 0959   BUN 22 06/09/2019 1228   BUN 25.2 06/05/2017 1003   CREATININE 1.21 10/19/2021 0959   CREATININE 1.3 06/05/2017 1003      Component Value Date/Time   CALCIUM 9.5 10/19/2021 0959   CALCIUM 9.9 06/05/2017 1003   ALKPHOS 112 10/19/2021 0959   ALKPHOS 78 06/05/2017 1003   AST 27 10/19/2021 0959   AST 35 (H) 06/05/2017 1003   ALT 36 10/19/2021 0959    ALT 63 (H) 06/05/2017 1003   BILITOT 0.5 10/19/2021 0959   BILITOT 0.37 06/05/2017 1003                  Impression and Plan:  76 year old man with:   1.  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    He continues to be on Zytiga without any major complications.  Risks and benefits of continuing this treatment were discussed.  These complications include hypertension, fatigue and adrenal insufficiency.  Alternative treatment options including systemic chemotherapy will be deferred if he has relapsed disease.  His PSA continues to be undetectable.  2. Hypertension: Has been pressure is within normal range.  3. Hypokalemia: Potassium remains adequate at this time and will replace as needed.  4. Androgen depravation therapy: He is currently on Eligard which will be continued today.  Complication clinic weight gain hot flashes among others were discussed.   5.  Goals of care and prognosis: His disease is incurable.  Aggressive measures are warranted given his Small sized  6.  Microcytic anemia: His MCV is down further with elevated RDW.  Iron studies are currently pending but I recommended starting iron sulfate twice a day.  He reports that he had a procedure that sounds like a colonoscopy in the last 2 months at [facility].  I do not have these records to confirm that but by his description sounds like he has had multiple colonoscopies in his adult life.  We will obtain these records to make sure that he has adequate colon cancer screening.  7. Follow-up: In 4 months for a follow-up.  30  minutes were spent on this visit.  The time was dedicated to reviewing laboratory data, disease status update and outlining future plan of care discussion.   Zola Button, MD 9/14/20231:18 PM

## 2022-03-15 ENCOUNTER — Telehealth: Payer: Self-pay | Admitting: *Deleted

## 2022-03-15 LAB — PROSTATE-SPECIFIC AG, SERUM (LABCORP): Prostate Specific Ag, Serum: <0.1 ng/mL (ref 0.0–4.0)

## 2022-03-15 NOTE — Telephone Encounter (Signed)
PC to patient, informed him of PSA results, he verbalizes understanding. 

## 2022-03-15 NOTE — Telephone Encounter (Signed)
-----   Message from Wyatt Portela, MD sent at 03/15/2022  8:57 AM EDT ----- Please let him know his PSA is down

## 2022-03-15 NOTE — Progress Notes (Signed)
The results of his colonoscopy completed on January 29, 2022 showed no evidence of malignancy but mild diverticulosis.

## 2022-03-26 ENCOUNTER — Other Ambulatory Visit (HOSPITAL_COMMUNITY): Payer: Self-pay

## 2022-04-09 ENCOUNTER — Other Ambulatory Visit (HOSPITAL_COMMUNITY): Payer: Self-pay

## 2022-04-09 ENCOUNTER — Other Ambulatory Visit: Payer: Self-pay | Admitting: Oncology

## 2022-04-09 DIAGNOSIS — C61 Malignant neoplasm of prostate: Secondary | ICD-10-CM

## 2022-04-09 MED ORDER — ABIRATERONE ACETATE 250 MG PO TABS
ORAL_TABLET | ORAL | 3 refills | Status: DC
  Filled 2022-04-09: qty 120, 30d supply, fill #0

## 2022-04-11 ENCOUNTER — Telehealth: Payer: Self-pay | Admitting: Pharmacy Technician

## 2022-04-11 ENCOUNTER — Other Ambulatory Visit: Payer: Self-pay | Admitting: Oncology

## 2022-04-11 ENCOUNTER — Other Ambulatory Visit (HOSPITAL_COMMUNITY): Payer: Self-pay

## 2022-04-11 ENCOUNTER — Telehealth: Payer: Self-pay

## 2022-04-11 DIAGNOSIS — C61 Malignant neoplasm of prostate: Secondary | ICD-10-CM

## 2022-04-11 MED ORDER — ENZALUTAMIDE 40 MG PO TABS: 160.0000 mg | ORAL_TABLET | Freq: Every day | ORAL | 3 refills | Status: DC

## 2022-04-11 MED ORDER — ENZALUTAMIDE 40 MG PO TABS
160.0000 mg | ORAL_TABLET | Freq: Every day | ORAL | 3 refills | Status: DC
  Filled 2022-04-11: qty 120, 30d supply, fill #0

## 2022-04-11 NOTE — Telephone Encounter (Signed)
Oral Oncology Pharmacist Encounter  Received new prescription for enzalutamide Gillermina Phy) for the treatment of metastatic castration sensitive prostate cancer in conjunction with Eligard, planned duration until disease progression or unacceptable toxicity. Patient has run out of grant funds requiring switch of therapy from zytiga. MD sent prescription and is agreeable to change.   Labs from 03/14/22 assessed, no interventions needed. Prescription dose and frequency assessed. Prescription forwarded to [employer] through patient assistance program.   Current medication list in Epic reviewed, DDIs with Xtandi identified: - amlodipine and atorvastatin: concentrations may be reduced although will verify with patient if he is still taking medication.   Evaluated chart and no patient barriers to medication adherence noted.   Patient agreement for treatment documented in MD note on 03/14/22.  Prescription has been e-scribed to the [facility] for benefits analysis and approval.  Oral Oncology Clinic will continue to follow for insurance authorization, copayment issues, initial counseling and start date.  Drema Halon, PharmD Hematology/Oncology Clinical Pharmacist [facility] [phone] 04/11/2022 9:29 AM

## 2022-04-11 NOTE — Telephone Encounter (Signed)
Oral Oncology Patient Advocate Encounter  Completed application for XTANDI in an effort to reduce patient's out of pocket expense to $0.    Application completed online and submitted to [employer].   [employer] patient assistance phone number for follow up is [phone].   This encounter will be updated until final determination.    Boniface Goffe, CPhT-Adv Oncology Pharmacy Patient Advocate [facility] Direct Number: [phone]  Fax: [phone]   

## 2022-04-11 NOTE — Telephone Encounter (Signed)
Oral Oncology Patient Advocate Encounter  Prior Authorization for Gillermina Phy has been approved.    PA# [id number] Effective dates: 04/11/2022 through 07/01/2023  Patients co-pay is $865.94.    Lady Deutscher, CPhT-Adv Oncology Pharmacy Patient [place] Direct Number: [phone]  Fax: [phone]

## 2022-04-11 NOTE — Telephone Encounter (Signed)
Oral Oncology Patient Advocate Encounter   Received notification that prior authorization for James [name] is required.   PA submitted on 04/11/2022 Key [id number] Status is pending     Lady Deutscher, CPhT-Adv Oncology Pharmacy Patient [place] Direct Number: [phone]  Fax: [phone]

## 2022-04-17 ENCOUNTER — Other Ambulatory Visit (HOSPITAL_COMMUNITY): Payer: Self-pay

## 2022-04-17 NOTE — Telephone Encounter (Signed)
Oral Oncology Patient Advocate Encounter   Received notification that the application for assistance for Xtandi through [employer] has been approved.   [employer] number [phone].   Effective dates: 04/17/2022 through 06/30/2022  I have spoken to the patient.  Lady Deutscher, CPhT-Adv Oncology Pharmacy Patient [place] Direct Number: [phone]  Fax: [phone]

## 2022-04-22 ENCOUNTER — Other Ambulatory Visit (HOSPITAL_COMMUNITY): Payer: Self-pay

## 2022-04-23 NOTE — Telephone Encounter (Signed)
Oral Chemotherapy Pharmacist Encounter  I spoke with patient for overview of: Xtandi for the treatment of metastatic, castration-sensitive prostate cancer in conjunction with Eligard, planned duration until disease progression or unacceptable toxicity.   Counseled patient on administration, dosing, side effects, monitoring, drug-food interactions, safe handling, storage, and disposal.  Patient will take Xtandi '40mg'$  capsules, 4 capsules ('160mg'$ ) by mouth once daily without regard to food.  Xtandi start date: 04/24/2022 Patients last dose of zytiga is today, 10/24. Patient does not have any zytiga or prednisone on hand and will start xtandi tomorrow.  Adverse effects include but are not limited to: peripheral edema, GI upset, hypertension, hot flashes, fatigue, falls/fractures, and arthralgias.   Patient instructed about small risk of seizures with Xtandi treatment.  Reviewed with patient importance of keeping a medication schedule and plan for any missed doses. No barriers to medication adherence identified.  Medication reconciliation performed and medication/allergy list updated.  Insurance authorization for Gillermina Phy has been obtained. Patient receives medication from patient assistance and it is supposed to be delivered today, 10/24 to patients house.  Patient informed the pharmacy will reach out 5-7 days prior to needing next fill of Xtandi to coordinate continued medication acquisition to prevent break in therapy.  All questions answered.  Mr. Deavers voiced understanding and appreciation.   Medication education handout placed in mail for patient. Patient knows to call the office with questions or concerns. Oral Chemotherapy Clinic phone number provided to patient.   Drema Halon, PharmD Hematology/Oncology Clinical Pharmacist [facility] [phone] 04/23/2022   9:41 AM

## 2022-06-12 ENCOUNTER — Telehealth: Payer: Self-pay | Admitting: Pharmacy Technician

## 2022-06-12 ENCOUNTER — Other Ambulatory Visit: Payer: Self-pay | Admitting: *Deleted

## 2022-06-12 DIAGNOSIS — C61 Malignant neoplasm of prostate: Secondary | ICD-10-CM

## 2022-06-12 MED ORDER — ENZALUTAMIDE 40 MG PO TABS: 160.0000 mg | ORAL_TABLET | Freq: Every day | ORAL | 3 refills | Status: DC

## 2022-06-12 NOTE — Telephone Encounter (Signed)
Oral Oncology Patient Advocate Encounter   Received notification that patient is due for re-enrollment for assistance for Xtandi through [employer].   Re-enrollment process has been initiated. Rx must be sent to [employer] for final verification.  [facility]arr[facility] phone number [phone].   I will continue to follow until final determination.  Lady Deutscher, CPhT-Adv Oncology Pharmacy Patient [place] Direct Number: [phone]  Fax: [phone]

## 2022-06-17 NOTE — Telephone Encounter (Signed)
Oral Oncology Patient Advocate Encounter   Received notification re-enrollment for assistance for Xtandi through [employer] has been approved. Patient may continue to receive their medication at $0 from this program.    [employer] phone number [phone].   Effective dates: 07/01/22 through 07/01/23  James [name], [place] Patient [place] Direct Number: [phone]  Fax: [phone]

## 2022-07-25 ENCOUNTER — Other Ambulatory Visit: Payer: Self-pay

## 2022-07-25 ENCOUNTER — Emergency Department (HOSPITAL_COMMUNITY)
Admission: EM | Admit: 2022-07-25 | Discharge: 2022-07-25 | Disposition: A | Discharge status: Home / Self Care | Payer: Medicare HMO | Attending: Emergency Medicine | Admitting: Emergency Medicine

## 2022-07-25 ENCOUNTER — Encounter (HOSPITAL_COMMUNITY): Payer: Self-pay | Admitting: Emergency Medicine

## 2022-07-25 ENCOUNTER — Emergency Department (HOSPITAL_COMMUNITY): Payer: Medicare HMO

## 2022-07-25 DIAGNOSIS — R519 Headache, unspecified: Secondary | ICD-10-CM | POA: Insufficient documentation

## 2022-07-25 DIAGNOSIS — I10 Essential (primary) hypertension: Secondary | ICD-10-CM | POA: Insufficient documentation

## 2022-07-25 DIAGNOSIS — Z79899 Other long term (current) drug therapy: Secondary | ICD-10-CM | POA: Insufficient documentation

## 2022-07-25 LAB — CBC WITH DIFFERENTIAL/PLATELET
Abs Immature Granulocytes: 0.01 10*3/uL (ref 0.00–0.07)
Basophils Absolute: 0 10*3/uL (ref 0.0–0.1)
Basophils Relative: 1 %
Eosinophils Absolute: 0.2 10*3/uL (ref 0.0–0.5)
Eosinophils Relative: 5 %
HCT: 42.5 % (ref 39.0–52.0)
Hemoglobin: 13.8 g/dL (ref 13.0–17.0)
Immature Granulocytes: 0 %
Lymphocytes Relative: 27 %
Lymphs Abs: 1.2 10*3/uL (ref 0.7–4.0)
MCH: 29.9 pg (ref 26.0–34.0)
MCHC: 32.5 g/dL (ref 30.0–36.0)
MCV: 92 fL (ref 80.0–100.0)
Monocytes Absolute: 0.5 10*3/uL (ref 0.1–1.0)
Monocytes Relative: 11 %
Neutro Abs: 2.6 10*3/uL (ref 1.7–7.7)
Neutrophils Relative %: 56 %
Platelets: 174 10*3/uL (ref 150–400)
RBC: 4.62 MIL/uL (ref 4.22–5.81)
RDW: 14.7 % (ref 11.5–15.5)
WBC: 4.5 10*3/uL (ref 4.0–10.5)
nRBC: 0 % (ref 0.0–0.2)

## 2022-07-25 LAB — BASIC METABOLIC PANEL
Anion gap: 8 (ref 5–15)
BUN: 16 mg/dL (ref 8–23)
CO2: 26 mmol/L (ref 22–32)
Calcium: 9.9 mg/dL (ref 8.9–10.3)
Chloride: 103 mmol/L (ref 98–111)
Creatinine, Ser: 1.06 mg/dL (ref 0.61–1.24)
GFR, Estimated: >60 mL/min (ref >60)
Glucose, Bld: 102 mg/dL — ABNORMAL HIGH (ref 70–99)
Potassium: 4 mmol/L (ref 3.5–5.1)
Sodium: 137 mmol/L (ref 135–145)

## 2022-07-25 MED ORDER — IBUPROFEN 400 MG PO TABS
600.0000 mg | ORAL_TABLET | Freq: Once | ORAL | Status: AC
  Administered 2022-07-25: 600 mg via ORAL
  Filled 2022-07-25: qty 1

## 2022-07-25 NOTE — ED Provider Triage Note (Signed)
Emergency Medicine Provider Triage Evaluation Note  James [name] , a 77 y.o. male  was evaluated in triage.  Pt complains of headache.  Started 2 weeks ago.  Patient awoke from sleep and it came on all of a sudden.  The headache comes and goes.  Denies visual disturbance, facial droop or slurred speech.  States his memory has been a little bit "off".  Was going to see his PCP today for this complaint but advised to come to the ED for further evaluation.  Denies fever or nuchal rigidity.  Denies trauma.  Review of Systems  Positive: See above Negative: See above  Physical Exam  BP (!) 127/93 (BP Location: Right Arm)   Pulse 78   Temp 98.8 F (37.1 C) (Oral)   Resp 16   SpO2 98%  Gen:   Awake, no distress  Resp:  Normal effort  MSK:   Moves extremities without difficulty  Other:    Medical Decision Making  Medically screening exam initiated at 11:09 AM.  Appropriate orders placed.  LAUREN AGUAYO was informed that the remainder of the evaluation will be completed by another provider, this initial triage assessment does not replace that evaluation, and the importance of remaining in the ED until their evaluation is complete.  Work up started   Harriet Pho, PA-C 07/25/22 1110

## 2022-07-25 NOTE — ED Provider Notes (Signed)
[facility] Provider Note   CSN: [id number] Arrival date & time: 07/25/22  1058     History  Chief Complaint  Patient presents with   Headache    James [name] is a 77 y.o. male with prostate cancer presents with headache.   Patient was brought from his PCP office today for headache that started 2-3 weeks ago.  He states it woke him from sleep and came on very suddenly with a "jolt" but is now intermittent.  Had that jolt once as a teenager but had never had it since then.  The headache is all over his head, not in any particular area, hard to describe and heart rate on a pain scale.  He does not feel the headache currently after receiving ibuprofen from triage.  He had not tried any Tylenol or ibuprofen at home.Marland Kitchen  He denies any facial droop, slurred speech, changes to his vision.  He does endorse that his memory has been "off."  Also denies any fever/chills, recent illnesses, chest pain, shortness of breath, head trauma or falls, neck pain/stiffness, nausea vomiting diarrhea constipation, urinary symptoms.  He does not normally get headaches.  He is never had a headache like this before.  Patient states he was worried that the jolt sensation is what he imagines a stroke would feel like and with the trouble remembering things he went to the doctor today who suggested he come into the emergency room.   Headache      Home Medications Prior to Admission medications   Medication Sig Start Date End Date Taking? Authorizing Provider  amLODipine (NORVASC) 10 MG tablet Take 10 mg by mouth daily.    [provider]  atorvastatin (LIPITOR) 40 MG tablet  05/20/17   [provider]  butalbital-acetaminophen-caffeine (FIORICET) 50-325-40 MG tablet Take by mouth 2 (two) times daily as needed for headache.    [provider]  enzalutamide Gillermina Phy) 40 MG tablet Take 4 tablets (160 mg total) by mouth daily. 06/12/22   Wyatt Portela, MD  ferrous sulfate 325 (65 FE) MG EC tablet Take 1 tablet (325 mg total) by mouth 2 (two) times daily. 03/14/22   Wyatt Portela, MD      Allergies    Patient has no known allergies.    Review of Systems   Review of Systems  Neurological:  Positive for headaches.   Review of systems Negative for f/c.  A 10 point review of systems was performed and is negative unless otherwise reported in HPI.  Physical Exam Updated Vital Signs BP 126/74 (BP Location: Right Arm)   Pulse 63   Temp 97.6 F (36.4 C)   Resp 18   SpO2 97%  Physical Exam General: Normal appearing male, lying in bed.  HEENT: PERRLA, EOMI, Sclera anicteric, MMM, trachea midline.  Cardiology: RRR, no murmurs/rubs/gallops. BL radial and DP pulses equal bilaterally.  Resp: Normal respiratory rate and effort. CTAB, no wheezes, rhonchi, crackles.  Abd: Soft, non-tender, non-distended. No rebound tenderness or guarding.  GU: Deferred. MSK: No peripheral edema or signs of trauma. Extremities without deformity or TTP. No cyanosis or clubbing. Skin: warm, dry. No rashes or lesions. Neuro: A&Ox4, CNs II-XII grossly intact. 5/5 strength in all extremities. Sensation grossly intact.  Normal speech, temperature is midline. Psych: Normal mood and affect.   ED Results / Procedures / Treatments   Labs (all labs ordered are listed, but only abnormal results are displayed) Labs Reviewed  BASIC METABOLIC PANEL - Abnormal; Notable for the following components:      Result Value   Glucose, Bld 102 (*)    All other components within normal limits  CBC WITH DIFFERENTIAL/PLATELET    EKG None  Radiology CT Head Wo Contrast  Result Date: 07/25/2022 CLINICAL DATA:  Headache EXAM: CT HEAD WITHOUT CONTRAST TECHNIQUE: Contiguous axial images were obtained from the base of the skull through the vertex without intravenous contrast. RADIATION DOSE REDUCTION: This exam was performed according to the departmental dose-optimization program  which includes automated exposure control, adjustment of the mA and/or kV according to patient size and/or use of iterative reconstruction technique. COMPARISON:  MRI Brain 07/07/19 FINDINGS: Brain: No CT evidence of acute cortical infarction, hemorrhage, hydrocephalus, extra-axial collection or mass lesion/mass effect. There is sequela of moderate to severe chronic microvascular ischemic change, progressed compared to 2021. Vascular: No hyperdense vessel or unexpected calcification. Skull: Normal. Negative for fracture or focal lesion. Sinuses/Orbits: No middle ear or mastoid effusion. Trace mucosal thickening of the floor of the left maxillary sinus. Bilateral orbits are unremarkable. There may be a chronic fracture of the lamina papyracea on the left. Other: None IMPRESSION: 1. No acute intracranial abnormality. 2. Sequela of severe chronic microvascular ischemic change, progressed compared to 2021. Electronically Signed   By: Marin Roberts M.D.   On: 07/25/2022 12:30    Procedures Procedures    Medications Ordered in ED Medications  ibuprofen (ADVIL) tablet 600 mg (600 mg Oral Given 07/25/22 1117)    ED Course/ Medical Decision Making/ A&P                          Medical Decision Making Amount and/or Complexity of Data Reviewed Labs:  Decision-making details documented in ED Course. Radiology:  Decision-making details documented in ED Course.    This patient presents to the ED for concern of headache, this involves an extensive number of treatment options, and is a complaint that carries with it a high risk of complications and morbidity.  I considered the following differential and admission for this acute, potentially life threatening condition.   MDM:    For patient's headache consider tension headache, migraine headache.  With the jolt sensation he reported will consider acute SAH though this does not clinically track with patient's symptoms that have been and only intermittent and  with no nuchal rigidity for 2 to 3 weeks .  Will obtain a CTh anyway to evaluate.  Also consider trigeminal neuralgia but patient only had 1 episode of that shoulder and has not had it since then.  Patient is very worried he may have had an ischemic CVA and I do not see any evidence of that on exam.  Additionally he is A&O x 4 and while he may memory trouble that he reports, he is oriented here today and demonstrates capacity, normal mood and affect as well.  Also consider electrolyte derangements, anemia, will obtain basic labs to evaluate.  Patient is overall well-appearing and afebrile, nontoxic, he has no pain currently.  Do not believe patient will require admission for this condition and the believe he is overall stable to follow-up with his PCP pending his workup.   Clinical Course as of 07/25/22 1635  Thu Jul 25, 2022  4627 Basic metabolic panel(!) Unremarkable [HN]  1612 CBC with Differential No leukocytosis or anemia, unremarkable. [HN]  1612 CT Head Wo Contrast 1. No acute intracranial abnormality. 2. Sequela of severe  chronic microvascular ischemic change, progressed compared to 2021.   [HN]    Clinical Course User Index [HN] Audley Hose, MD    Labs: I Ordered, and personally interpreted labs.  The pertinent results include: Listed above  Imaging Studies ordered: I ordered imaging studies including CTH I independently visualized and interpreted imaging. I agree with the radiologist interpretation  Additional history obtained from chart review  Reevaluation: After the interventions noted above, I reevaluated the patient and found that they have :resolved  Social Determinants of Health: Patient lives independently  Disposition: Patient with acute non-intractable headache that is currently resolved.  Reassuring workup and imaging.  DC with discharge instructions, return precautions, instructed to take Tylenol ibuprofen at home for headache and follow-up with his  primary care physician in 1 to 2 weeks.  All questions answered to patient satisfaction.  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[place])    first dx'd-- 2010  post external beam radiation--  recurrent march 2015  s/p cryoablation (in [place])   Wears glasses      Medicines Meds ordered this encounter  Medications   ibuprofen (ADVIL) tablet 600 mg    I have reviewed the patients home medicines and have made adjustments as needed  Problem List / ED Course: Problem List Items Addressed This Visit   None Visit Diagnoses     Acute nonintractable headache, unspecified headache type    -  Primary   Relevant Medications   ibuprofen (ADVIL) tablet 600 mg (Completed)                   This note was created using dictation software, which may contain spelling or grammatical errors.    Audley Hose, MD 07/25/22 [phone]

## 2022-07-25 NOTE — ED Triage Notes (Signed)
Pt BIB [facility] from PCP office with reports of headache. Pt states he feels "a thump in my head and my memory is not as good as it has been."

## 2022-07-25 NOTE — Discharge Instructions (Signed)
Thank you for coming to [facility] Emergency Department. You were seen for headache, memory problems. We did an exam, labs, and imaging, and these showed no acute findings. You can alternate taking Tylenol and ibuprofen as needed for pain. You can take '650mg'$  tylenol (acetaminophen) every 4-6 hours, and 600 mg ibuprofen 3 times a day.  Please follow up with your primary care provider within 1 week. They may decide they want to pursue further workup or imaging.  Do not hesitate to return to the ED or call 911 if you experience: -Worsening symptoms -Changes to your vision or hearing -Trouble speaking ,swallowing -Numbness/tingling, asymmetric weakness -Excessive drowsiness -Seizure -Lightheadedness, passing out, or severe dizziness -Fevers/chills -Anything else that concerns you

## 2022-07-25 NOTE — ED Notes (Signed)
Pt's daughter would like an update whenever someone is able to call. Corrado Hymon [phone]

## 2022-08-09 ENCOUNTER — Telehealth: Payer: Self-pay | Admitting: Oncology

## 2022-08-09 NOTE — Telephone Encounter (Signed)
Scheduled patient with new provider per provider chart notes. Patient is notified of upcoming appointments.

## 2022-08-14 ENCOUNTER — Inpatient Hospital Stay (HOSPITAL_BASED_OUTPATIENT_CLINIC_OR_DEPARTMENT_OTHER): Payer: Medicare HMO | Admitting: Hematology and Oncology

## 2022-08-14 ENCOUNTER — Other Ambulatory Visit: Payer: Self-pay

## 2022-08-14 ENCOUNTER — Telehealth: Payer: Self-pay | Admitting: Hematology and Oncology

## 2022-08-14 ENCOUNTER — Inpatient Hospital Stay: Payer: Medicare HMO | Attending: Nurse Practitioner

## 2022-08-14 VITALS — BP 150/99 | HR 88 | Temp 97.7°F | Resp 15 | Wt 176.4 lb

## 2022-08-14 DIAGNOSIS — C61 Malignant neoplasm of prostate: Secondary | ICD-10-CM | POA: Diagnosis present

## 2022-08-14 DIAGNOSIS — Z191 Hormone sensitive malignancy status: Secondary | ICD-10-CM | POA: Diagnosis not present

## 2022-08-14 DIAGNOSIS — Z87891 Personal history of nicotine dependence: Secondary | ICD-10-CM | POA: Insufficient documentation

## 2022-08-14 DIAGNOSIS — E785 Hyperlipidemia, unspecified: Secondary | ICD-10-CM | POA: Diagnosis not present

## 2022-08-14 DIAGNOSIS — D649 Anemia, unspecified: Secondary | ICD-10-CM

## 2022-08-14 DIAGNOSIS — Z923 Personal history of irradiation: Secondary | ICD-10-CM | POA: Insufficient documentation

## 2022-08-14 DIAGNOSIS — I10 Essential (primary) hypertension: Secondary | ICD-10-CM | POA: Insufficient documentation

## 2022-08-14 DIAGNOSIS — Z79899 Other long term (current) drug therapy: Secondary | ICD-10-CM | POA: Diagnosis not present

## 2022-08-14 DIAGNOSIS — Z79818 Long term (current) use of other agents affecting estrogen receptors and estrogen levels: Secondary | ICD-10-CM | POA: Insufficient documentation

## 2022-08-14 DIAGNOSIS — K219 Gastro-esophageal reflux disease without esophagitis: Secondary | ICD-10-CM | POA: Insufficient documentation

## 2022-08-14 LAB — CMP (CANCER CENTER ONLY)
ALT: 20 U/L (ref 0–44)
AST: 30 U/L (ref 15–41)
Albumin: 3.9 g/dL (ref 3.5–5.0)
Alkaline Phosphatase: 69 U/L (ref 38–126)
Anion gap: 13 (ref 5–15)
BUN: 22 mg/dL (ref 8–23)
CO2: 22 mmol/L (ref 22–32)
Calcium: 10.1 mg/dL (ref 8.9–10.3)
Chloride: 100 mmol/L (ref 98–111)
Creatinine: 1.18 mg/dL (ref 0.61–1.24)
GFR, Estimated: >60 mL/min (ref >60)
Glucose, Bld: 102 mg/dL — ABNORMAL HIGH (ref 70–99)
Potassium: 4.5 mmol/L (ref 3.5–5.1)
Sodium: 135 mmol/L (ref 135–145)
Total Bilirubin: 0.6 mg/dL (ref 0.3–1.2)
Total Protein: 8 g/dL (ref 6.5–8.1)

## 2022-08-14 LAB — CBC WITH DIFFERENTIAL (CANCER CENTER ONLY)
Abs Immature Granulocytes: 0.01 10*3/uL (ref 0.00–0.07)
Basophils Absolute: 0 10*3/uL (ref 0.0–0.1)
Basophils Relative: 1 %
Eosinophils Absolute: 0.2 10*3/uL (ref 0.0–0.5)
Eosinophils Relative: 5 %
HCT: 41.6 % (ref 39.0–52.0)
Hemoglobin: 14.6 g/dL (ref 13.0–17.0)
Immature Granulocytes: 0 %
Lymphocytes Relative: 33 %
Lymphs Abs: 1.5 10*3/uL (ref 0.7–4.0)
MCH: 31.7 pg (ref 26.0–34.0)
MCHC: 35.1 g/dL (ref 30.0–36.0)
MCV: 90.4 fL (ref 80.0–100.0)
Monocytes Absolute: 0.5 10*3/uL (ref 0.1–1.0)
Monocytes Relative: 11 %
Neutro Abs: 2.4 10*3/uL (ref 1.7–7.7)
Neutrophils Relative %: 50 %
Platelet Count: 192 10*3/uL (ref 150–400)
RBC: 4.6 MIL/uL (ref 4.22–5.81)
RDW: 15.6 % — ABNORMAL HIGH (ref 11.5–15.5)
WBC Count: 4.7 10*3/uL (ref 4.0–10.5)
nRBC: 0 % (ref 0.0–0.2)

## 2022-08-14 LAB — IRON AND IRON BINDING CAPACITY (CC-WL,HP ONLY)
Iron: 104 ug/dL (ref 45–182)
Saturation Ratios: 25 % (ref 17.9–39.5)
TIBC: 414 ug/dL (ref 250–450)
UIBC: 310 ug/dL

## 2022-08-14 NOTE — Telephone Encounter (Signed)
Called patient to schedule injection per high priority in basket. Patient scheduled and notified.

## 2022-08-14 NOTE — Progress Notes (Signed)
[facility] Telephone:([phone]   [phone]  PROGRESS NOTE  Patient Care Team: [name], MD as PCP - General (Family Medicine)  Hematological/Oncological History # Castration Sensitive Advance Prostate Cancer 2013: radiation therapy in [place]: prostate biopsy showed a Gleason score 4+3 = 7 as well as a another foci of Gleason score 4+4 = 8. He underwent a repeat cryotherapy  12/01/2015: PSA went up to 2.96 and a PET scan obtained, showed marked hypermetabolic right pelvic sidewall lymph node consistent with metastatic disease  12/15/2015: biopsy confirmed the presence of metastatic prostate cancer  July 2017: started zytiga 1000 mg  03/14/2022: last visit with Dr. Alen Blew  06/12/2022: Transition to enzalutamide 160 mg p.o. daily due to insurance issues. 08/14/2022: transition care to Dr. Lorenso Courier   Interval History:  James [name] 77 y.o. male with medical history significant for [facility] who presents for a follow up visit. The patient's last visit was on 9/14/203 with Dr. Alen Blew. In the interim since the last visit he has continued on his Zytiga, but had to transition to enzalutamide due to insurance issues.  On exam today Mr. [name] notes that he does have periodic issues with headaches.  He notes that he has not been having any bleeding or blood in his stool.  He under went to colonoscopy in August 2023.  He reports that he was tolerating Zytiga therapy well but had to transition to enzalutamide.  He is due for an Eligard shot but does not currently have one scheduled.  He has had some weight loss in the interim since her last visit, dropping from 197 pounds down to 176 pounds.  He reports that he is eating less and he is just less hungry.  He otherwise denies any fevers, chills, sweats, nausea, vomiting or diarrhea.  A full 10 point ROS was otherwise negative.  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[place])    first dx'd-- 2010  post external beam radiation--  recurrent march 2015  s/p cryoablation (in [place])   Wears glasses     SURGICAL HISTORY: Past Surgical History:  Procedure Laterality Date   COLONOSCOPY  last one 2014   CRYOABLATION N/A 12/05/2014   Procedure: CRYO ABLATION PROSTATE;  Surgeon: Carolan Clines, MD;  Location: [facility];  Service: Urology;  Laterality: N/A;   PROSTATE CRYOABLATION  March 2015  ([place])   [place] AND LITTLE FINGERS  2014    post numbness residual    SOCIAL HISTORY: Social History   Socioeconomic History   Marital status: Single    Spouse name: Not on file   Number of children: Not on file   Years of education: Not on file   Highest education level: Not on file  Occupational History   Not on file  Tobacco Use   Smoking status: Former    Packs/day: 1.00    Years: 40.00    Total pack years: 40.00    Types: Cigarettes    Quit date: 11/29/2012    Years since quitting: 9.7   Smokeless tobacco: Never  Substance and Sexual Activity   Alcohol use: No   Drug use: No   Sexual activity: Not on file  Other Topics Concern   Not on file  Social History Narrative   Not on file   Social  Determinants of Health   Financial Resource Strain: Not on file  Food Insecurity: Not on file  Transportation Needs: Not on file  Physical Activity: Not on file  Stress: Not on file  Social Connections: Not on file  Intimate Partner Violence: Not on file    FAMILY HISTORY: No family history on file.  ALLERGIES:  has No Known Allergies.  MEDICATIONS:  Current Outpatient Medications  Medication Sig Dispense Refill   amLODipine (NORVASC) 10 MG tablet Take 10 mg by mouth daily.     atorvastatin (LIPITOR) 40 MG tablet      enzalutamide (XTANDI) 40 MG tablet Take 4 tablets (160 mg total) by mouth daily. 120 tablet 3    ibuprofen (ADVIL) 200 MG tablet Take 200 mg by mouth every 6 (six) hours as needed for headache.     ferrous sulfate 325 (65 FE) MG EC tablet Take 1 tablet (325 mg total) by mouth 2 (two) times daily. (Patient not taking: Reported on 08/14/2022) 60 tablet 3   metoprolol succinate (TOPROL-XL) 25 MG 24 hr tablet Take 25 mg by mouth daily.     No current facility-administered medications for this visit.    REVIEW OF SYSTEMS:   Constitutional: ( - ) fevers, ( - )  chills , ( - ) night sweats Eyes: ( - ) blurriness of vision, ( - ) double vision, ( - ) watery eyes Ears, nose, mouth, throat, and face: ( - ) mucositis, ( - ) sore throat Respiratory: ( - ) cough, ( - ) dyspnea, ( - ) wheezes Cardiovascular: ( - ) palpitation, ( - ) chest discomfort, ( - ) lower extremity swelling Gastrointestinal:  ( - ) nausea, ( - ) heartburn, ( - ) change in bowel habits Skin: ( - ) abnormal skin rashes Lymphatics: ( - ) new lymphadenopathy, ( - ) easy bruising Neurological: ( - ) numbness, ( - ) tingling, ( - ) new weaknesses Behavioral/Psych: ( - ) mood change, ( - ) new changes  All other systems were reviewed with the patient and are negative.  PHYSICAL EXAMINATION: Vitals:   08/14/22 1538  BP: (!) 150/99  Pulse: 88  Resp: 15  Temp: 97.7 F (36.5 C)  SpO2: 100%   Filed Weights   08/14/22 1538  Weight: 176 lb 6.4 oz (80 kg)    GENERAL: Well-appearing elderly African-American male, alert, no distress and comfortable SKIN: skin color, texture, turgor are normal, no rashes or significant lesions EYES: conjunctiva are pink and non-injected, sclera clear LUNGS: clear to auscultation and percussion with normal breathing effort HEART: regular rate & rhythm and no murmurs and no lower extremity edema Musculoskeletal: no cyanosis of digits and no clubbing  PSYCH: alert & oriented x 3, fluent speech NEURO: no focal motor/sensory deficits  LABORATORY DATA:  I have reviewed the data as listed     Latest Ref Rng & Units 08/14/2022    3:12 PM 07/25/2022   11:34 AM 03/14/2022    1:01 PM  CBC  WBC 4.0 - 10.5 K/uL 4.7  4.5  5.1   Hemoglobin 13.0 - 17.0 g/dL 14.6  13.8  9.4   Hematocrit 39.0 - 52.0 % 41.6  42.5  29.5   Platelets 150 - 400 K/uL 192  174  197        Latest Ref Rng & Units 08/14/2022    3:12 PM 07/25/2022   11:34 AM 03/14/2022    1:01 PM  CMP  Glucose 70 - 99  mg/dL 102  102  106   BUN 8 - 23 mg/dL 22  16  18   $ Creatinine 0.61 - 1.24 mg/dL 1.18  1.06  1.10   Sodium 135 - 145 mmol/L 135  137  138   Potassium 3.5 - 5.1 mmol/L 4.5  4.0  3.5   Chloride 98 - 111 mmol/L 100  103  109   CO2 22 - 32 mmol/L 22  26  24   $ Calcium 8.9 - 10.3 mg/dL 10.1  9.9  9.7   Total Protein 6.5 - 8.1 g/dL 8.0   7.8   Total Bilirubin 0.3 - 1.2 mg/dL 0.6   0.3   Alkaline Phos 38 - 126 U/L 69   117   AST 15 - 41 U/L 30   25   ALT 0 - 44 U/L 20   21    RADIOGRAPHIC STUDIES: CT Head Wo Contrast  Result Date: 07/25/2022 CLINICAL DATA:  Headache EXAM: CT HEAD WITHOUT CONTRAST TECHNIQUE: Contiguous axial images were obtained from the base of the skull through the vertex without intravenous contrast. RADIATION DOSE REDUCTION: This exam was performed according to the departmental dose-optimization program which includes automated exposure control, adjustment of the mA and/or kV according to patient size and/or use of iterative reconstruction technique. COMPARISON:  MRI Brain 07/07/19 FINDINGS: Brain: No CT evidence of acute cortical infarction, hemorrhage, hydrocephalus, extra-axial collection or mass lesion/mass effect. There is sequela of moderate to severe chronic microvascular ischemic change, progressed compared to 2021. Vascular: No hyperdense vessel or unexpected calcification. Skull: Normal. Negative for fracture or focal lesion. Sinuses/Orbits: No middle ear or mastoid effusion. Trace mucosal thickening of the floor of the left maxillary sinus. Bilateral orbits are unremarkable. There may be a chronic  fracture of the lamina papyracea on the left. Other: None IMPRESSION: 1. No acute intracranial abnormality. 2. Sequela of severe chronic microvascular ischemic change, progressed compared to 2021. Electronically Signed   By: Marin Roberts M.D.   On: 07/25/2022 12:30    ASSESSMENT & PLAN [place] Meech Wittmann 77 y.o. male with medical history significant for [facility] who presents for a follow up visit.    # Castration Sensitive Advance Prostate Cancer --continue enzalutamide 160 mg PO daily  --continue leuprolide 30 mg q 4 months. Next due tomorrow.  Will transition to 22.5 mg q. 3 months moving forward. --lab today show white blood cell 4.7, hemoglobin 14.6, MCV 90.4, and platelets of 192 --most recent PSA <0.1 on 08/14/2022. Will collect testosterone with his labs.  --RTC in 3 months with labs   No orders of the defined types were placed in this encounter.   All questions were answered. The patient knows to call the clinic with any problems, questions or concerns.  A total of more than 40 minutes were spent on this encounter with face-to-face time and non-face-to-face time, including preparing to see the patient, ordering tests and/or medications, counseling the patient and coordination of care as outlined above.   Ledell Peoples, MD Department of Hematology/Oncology [facility] Phone: [phone] Pager: [phone] Email: [email or web address]  08/20/2022 5:24 PM

## 2022-08-15 ENCOUNTER — Other Ambulatory Visit: Payer: Self-pay | Admitting: Hematology and Oncology

## 2022-08-15 ENCOUNTER — Other Ambulatory Visit: Payer: Self-pay

## 2022-08-15 ENCOUNTER — Inpatient Hospital Stay: Payer: Medicare HMO

## 2022-08-15 VITALS — BP 138/85 | HR 63 | Temp 98.0°F | Resp 18

## 2022-08-15 DIAGNOSIS — C61 Malignant neoplasm of prostate: Secondary | ICD-10-CM

## 2022-08-15 LAB — FERRITIN: Ferritin: 49 ng/mL (ref 24–336)

## 2022-08-15 MED ORDER — LEUPROLIDE ACETATE (4 MONTH) 30 MG ~~LOC~~ KIT
30.0000 mg | PACK | Freq: Once | SUBCUTANEOUS | Status: AC
  Administered 2022-08-15: 30 mg via SUBCUTANEOUS
  Filled 2022-08-15: qty 30

## 2022-08-16 LAB — PROSTATE-SPECIFIC AG, SERUM (LABCORP): Prostate Specific Ag, Serum: <0.1 ng/mL (ref 0.0–4.0)

## 2022-08-20 ENCOUNTER — Encounter: Payer: Self-pay | Admitting: Hematology and Oncology

## 2022-08-21 ENCOUNTER — Telehealth: Payer: Self-pay | Admitting: Hematology and Oncology

## 2022-08-21 NOTE — Telephone Encounter (Signed)
Called patient per 2/20 IB message to schedule f/u. Patient scheduled and notified. Forwarding patient concerns to RN.

## 2022-11-13 ENCOUNTER — Institutional Professional Consult (permissible substitution): Payer: Medicare HMO | Admitting: Neurology

## 2022-11-19 ENCOUNTER — Telehealth: Payer: Self-pay | Admitting: Hematology and Oncology

## 2022-11-20 ENCOUNTER — Inpatient Hospital Stay: Payer: Medicare HMO

## 2022-11-20 ENCOUNTER — Inpatient Hospital Stay: Payer: Medicare HMO | Admitting: Hematology and Oncology

## 2022-12-04 ENCOUNTER — Inpatient Hospital Stay: Payer: Medicare HMO | Attending: Nurse Practitioner

## 2022-12-04 ENCOUNTER — Inpatient Hospital Stay (HOSPITAL_BASED_OUTPATIENT_CLINIC_OR_DEPARTMENT_OTHER): Payer: Medicare HMO | Admitting: Physician Assistant

## 2022-12-04 ENCOUNTER — Other Ambulatory Visit: Payer: Self-pay | Admitting: Physician Assistant

## 2022-12-04 ENCOUNTER — Inpatient Hospital Stay: Payer: Medicare HMO

## 2022-12-04 VITALS — BP 127/78 | HR 68 | Temp 97.3°F | Resp 20 | Wt 190.0 lb

## 2022-12-04 DIAGNOSIS — E785 Hyperlipidemia, unspecified: Secondary | ICD-10-CM | POA: Insufficient documentation

## 2022-12-04 DIAGNOSIS — C61 Malignant neoplasm of prostate: Secondary | ICD-10-CM | POA: Insufficient documentation

## 2022-12-04 DIAGNOSIS — I10 Essential (primary) hypertension: Secondary | ICD-10-CM | POA: Insufficient documentation

## 2022-12-04 DIAGNOSIS — Z79818 Long term (current) use of other agents affecting estrogen receptors and estrogen levels: Secondary | ICD-10-CM | POA: Insufficient documentation

## 2022-12-04 DIAGNOSIS — Z87891 Personal history of nicotine dependence: Secondary | ICD-10-CM | POA: Diagnosis not present

## 2022-12-04 DIAGNOSIS — Z79899 Other long term (current) drug therapy: Secondary | ICD-10-CM | POA: Insufficient documentation

## 2022-12-04 DIAGNOSIS — Z923 Personal history of irradiation: Secondary | ICD-10-CM | POA: Diagnosis not present

## 2022-12-04 DIAGNOSIS — D509 Iron deficiency anemia, unspecified: Secondary | ICD-10-CM

## 2022-12-04 DIAGNOSIS — C775 Secondary and unspecified malignant neoplasm of intrapelvic lymph nodes: Secondary | ICD-10-CM | POA: Diagnosis not present

## 2022-12-04 DIAGNOSIS — K219 Gastro-esophageal reflux disease without esophagitis: Secondary | ICD-10-CM | POA: Diagnosis not present

## 2022-12-04 LAB — CMP (CANCER CENTER ONLY)
ALT: 18 U/L (ref 0–44)
AST: 27 U/L (ref 15–41)
Albumin: 3.9 g/dL (ref 3.5–5.0)
Alkaline Phosphatase: 70 U/L (ref 38–126)
Anion gap: 7 (ref 5–15)
BUN: 22 mg/dL (ref 8–23)
CO2: 25 mmol/L (ref 22–32)
Calcium: 10.1 mg/dL (ref 8.9–10.3)
Chloride: 107 mmol/L (ref 98–111)
Creatinine: 1.08 mg/dL (ref 0.61–1.24)
GFR, Estimated: >60 mL/min (ref >60)
Glucose, Bld: 91 mg/dL (ref 70–99)
Potassium: 4.3 mmol/L (ref 3.5–5.1)
Sodium: 139 mmol/L (ref 135–145)
Total Bilirubin: 0.3 mg/dL (ref 0.3–1.2)
Total Protein: 7.9 g/dL (ref 6.5–8.1)

## 2022-12-04 LAB — CBC WITH DIFFERENTIAL (CANCER CENTER ONLY)
Abs Immature Granulocytes: 0.01 10*3/uL (ref 0.00–0.07)
Basophils Absolute: 0 10*3/uL (ref 0.0–0.1)
Basophils Relative: 1 %
Eosinophils Absolute: 0.2 10*3/uL (ref 0.0–0.5)
Eosinophils Relative: 5 %
HCT: 37.3 % — ABNORMAL LOW (ref 39.0–52.0)
Hemoglobin: 13 g/dL (ref 13.0–17.0)
Immature Granulocytes: 0 %
Lymphocytes Relative: 32 %
Lymphs Abs: 1.5 10*3/uL (ref 0.7–4.0)
MCH: 31 pg (ref 26.0–34.0)
MCHC: 34.9 g/dL (ref 30.0–36.0)
MCV: 89 fL (ref 80.0–100.0)
Monocytes Absolute: 0.6 10*3/uL (ref 0.1–1.0)
Monocytes Relative: 12 %
Neutro Abs: 2.3 10*3/uL (ref 1.7–7.7)
Neutrophils Relative %: 50 %
Platelet Count: 170 10*3/uL (ref 150–400)
RBC: 4.19 MIL/uL — ABNORMAL LOW (ref 4.22–5.81)
RDW: 14.4 % (ref 11.5–15.5)
WBC Count: 4.6 10*3/uL (ref 4.0–10.5)
nRBC: 0 % (ref 0.0–0.2)

## 2022-12-04 LAB — FERRITIN: Ferritin: 21 ng/mL — ABNORMAL LOW (ref 24–336)

## 2022-12-04 LAB — IRON AND IRON BINDING CAPACITY (CC-WL,HP ONLY)
Iron: 58 ug/dL (ref 45–182)
Saturation Ratios: 14 % — ABNORMAL LOW (ref 17.9–39.5)
TIBC: 417 ug/dL (ref 250–450)
UIBC: 359 ug/dL (ref 117–376)

## 2022-12-04 MED ORDER — LEUPROLIDE ACETATE (3 MONTH) 22.5 MG IM KIT
22.5000 mg | PACK | Freq: Once | INTRAMUSCULAR | Status: AC
  Administered 2022-12-04: 22.5 mg via INTRAMUSCULAR
  Filled 2022-12-04: qty 22.5

## 2022-12-04 NOTE — Progress Notes (Unsigned)
[facility] Telephone:(336) [phone]   Fax:(336) [phone]  PROGRESS NOTE  Patient Care Team: Knox Royalty, MD as PCP - General (Family Medicine)  Hematological/Oncological History # Castration Sensitive Advance Prostate Cancer 2013: radiation therapy in [place]: prostate biopsy showed a Gleason score 4+3 = 7 as well as a another foci of Gleason score 4+4 = 8. He underwent a repeat cryotherapy  12/01/2015: PSA went up to 2.96 and a PET scan obtained, showed marked hypermetabolic right pelvic sidewall lymph node consistent with metastatic disease  12/15/2015: biopsy confirmed the presence of metastatic prostate cancer  July 2017: started zytiga 1000 mg  03/14/2022: last visit with Dr. Clelia Croft  06/12/2022: Transition to enzalutamide 160 mg p.o. daily due to insurance issues. 08/14/2022: transition care to Dr. Leonides Schanz   Interval History:  James [name] 77 y.o. male with medical history significant for [facility] who presents for a follow up visit. The patient's last visit was on 08/14/2022. In the interim since the last visit he has continued on enzalutamide and lupron injections.   On exam today James [name] notes that he is feeling well without any new or concerning symptoms. His energy is stable and he can complete his ADLs on his own. He has a good appetite without any weight changes. He denies any nausea, vomiting or abdominal pain. He denies any signs of bleeding including hematochezia or melena. He denies any new bone or back pain.  He otherwise denies any fevers, chills, sweats, nausea, vomiting or diarrhea.  A full 10 point ROS was otherwise negative.  MEDICAL HISTORY:  Past Medical History:  Diagnosis Date   GERD (gastroesophageal reflux disease)    Hyperlipidemia    Hypertension    Nocturia    Recurrent prostate adenocarcinoma (HCC)    first dx'd-- 2010  post external beam radiation--  recurrent march 2015  s/p cryoablation (in [place])   Wears glasses      SURGICAL HISTORY: Past Surgical History:  Procedure Laterality Date   COLONOSCOPY  last one 2014   CRYOABLATION N/A 12/05/2014   Procedure: CRYO ABLATION PROSTATE;  Surgeon: Jethro Bolus, MD;  Location: [facility];  Service: Urology;  Laterality: N/A;   PROSTATE CR[place]ABLATION  March 2015  ([place]   REPAIR RIGHT RING AND LITTLE FINGERS  2014    post numbness residual    SOCIAL HISTORY: Social History   Socioeconomic History   Marital status: Single    Spouse name: Not on file   Number of children: Not on file   Years of education: Not on file   Highest education level: Not on file  Occupational History   Not on file  Tobacco Use   Smoking status: Former    Packs/day: 1.00    Years: 40.00    Additional pack years: 0.00    Total pack years: 40.00    Types: Cigarettes    Quit date: 11/29/2012    Years since quitting: 10.0   Smokeless tobacco: Never  Substance and Sexual Activity   Alcohol use: No   Drug use: No   Sexual activity: Not on file  Other Topics Concern   Not on file  Social History Narrative   Not on file   Social Determinants of Health   Financial Resource Strain: Not on file  Food Insecurity: Not on file  Transportation Needs: Not on file  Physical Activity: Not on file  Stress: Not on file  Social Connections: Not on file  Intimate Partner Violence:  Not on file    FAMILY HISTORY: No family history on file.  ALLERGIES:  has No Known Allergies.  MEDICATIONS:  Current Outpatient Medications  Medication Sig Dispense Refill   amLODipine (NORVASC) 10 MG tablet Take 10 mg by mouth daily.     atorvastatin (LIPITOR) 40 MG tablet      enzalutamide (XTANDI) 40 MG tablet Take 4 tablets (160 mg total) by mouth daily. 120 tablet 3   ferrous sulfate 325 (65 FE) MG EC tablet Take 1 tablet (325 mg total) by mouth 2 (two) times daily. (Patient taking differently: Take 325 mg by mouth daily.) 60 tablet 3   ibuprofen (ADVIL) 200 MG  tablet Take 200 mg by mouth every 6 (six) hours as needed for headache.     metoprolol succinate (TOPROL-XL) 25 MG 24 hr tablet Take 25 mg by mouth daily.     oxybutynin (DITROPAN-XL) 10 MG 24 hr tablet Take 10 mg by mouth at bedtime. ER     No current facility-administered medications for this visit.    REVIEW OF SYSTEMS:   Constitutional: ( - ) fevers, ( - )  chills , ( - ) night sweats Eyes: ( - ) blurriness of vision, ( - ) double vision, ( - ) watery eyes Ears, nose, mouth, throat, and face: ( - ) mucositis, ( - ) sore throat Respiratory: ( - ) cough, ( - ) dyspnea, ( - ) wheezes Cardiovascular: ( - ) palpitation, ( - ) chest discomfort, ( - ) lower extremity swelling Gastrointestinal:  ( - ) nausea, ( - ) heartburn, ( - ) change in bowel habits Skin: ( - ) abnormal skin rashes Lymphatics: ( - ) new lymphadenopathy, ( - ) easy bruising Neurological: ( - ) numbness, ( - ) tingling, ( - ) new weaknesses Behavioral/Psych: ( - ) mood change, ( - ) new changes  All other systems were reviewed with the patient and are negative.  PHYSICAL EXAMINATION: Vitals:   12/04/22 1320  BP: 127/78  Pulse: 68  Resp: 20  Temp: (!) 97.3 F (36.3 C)  SpO2: 99%   Filed Weights   12/04/22 1320  Weight: 190 lb (86.2 kg)    GENERAL: Well-appearing elderly African-American male, alert, no distress and comfortable SKIN: skin color, texture, turgor are normal, no rashes or significant lesions EYES: conjunctiva are pink and non-injected, sclera clear LUNGS: clear to auscultation and percussion with normal breathing effort HEART: regular rate & rhythm and no murmurs and no lower extremity edema Musculoskeletal: no cyanosis of digits and no clubbing  PSYCH: alert & oriented x 3, fluent speech NEURO: no focal motor/sensory deficits  LABORATORY DATA:  I have reviewed the data as listed    Latest Ref Rng & Units 12/04/2022   12:30 PM 08/14/2022    3:12 PM 07/25/2022   11:34 AM  CBC  WBC 4.0 - 10.5  K/uL 4.6  4.7  4.5   Hemoglobin 13.0 - 17.0 g/dL 16.1  09.6  04.5   Hematocrit 39.0 - 52.0 % 37.3  41.6  42.5   Platelets 150 - 400 K/uL 170  192  174        Latest Ref Rng & Units 12/04/2022   12:30 PM 08/14/2022    3:12 PM 07/25/2022   11:34 AM  CMP  Glucose 70 - 99 mg/dL 91  409  811   BUN 8 - 23 mg/dL 22  22  16    Creatinine 0.61 - 1.24 mg/dL 9.14  1.18  1.06   Sodium 135 - 145 mmol/L 139  135  137   Potassium 3.5 - 5.1 mmol/L 4.3  4.5  4.0   Chloride 98 - 111 mmol/L 107  100  103   CO2 22 - 32 mmol/L 25  22  26    Calcium 8.9 - 10.3 mg/dL 40.9  81.1  9.9   Total Protein 6.5 - 8.1 g/dL 7.9  8.0    Total Bilirubin 0.3 - 1.2 mg/dL 0.3  0.6    Alkaline Phos 38 - 126 U/L 70  69    AST 15 - 41 U/L 27  30    ALT 0 - 44 U/L 18  20     RADIOGRAPHIC STUDIES: No results found.  ASSESSMENT & PLAN James [name] 77 y.o. male with medical history significant for [facility] who presents for a follow up visit.  # Castration Sensitive Advance Prostate Cancer --continue enzalutamide 160 mg PO daily  --continue leuprolide 22.5 mg q. 3 months --lab today show white blood cell 4.6, hemoglobin 13.0, MCV 89.0, and platelets of 170 --most recent PSA <0.1 on 08/14/2022. Today's pending.  --RTC in 3 months with labs   #Iron deficiency anemia: --Etiology unknown. Patient denies any overt signs of bleeding. He does have history of H.pylori infection and has completed therapy.  --Will request follow up with [facility] to further evaluate underlying cause.  --Currently taking PO ferrous sulfate 325 mg PO daily. Advised to take in the morning with a source of vitamin C.  --Labs today show no anemia with Hgb 13.0. Iron panel shows deficiency with saturation 14%, ferritin 21. --Will arrange for IV venofer 200 mg x 2 doses to bolster iron levels.   No orders of the defined types were placed in this encounter.   All questions were answered. The patient knows to call the clinic with any problems, questions or  concerns.  I have spent a total of 30 minutes minutes of face-to-face and non-face-to-face time, preparing to see the patient, performing a medically appropriate examination, counseling and educating the patient,  documenting clinical information in the electronic health record, and care coordination.   Georga Kaufmann PA-C Dept of Hematology and Oncology [facility] at [facility] Phone: [phone]   12/04/2022 4:25 PM

## 2022-12-05 ENCOUNTER — Encounter: Payer: Self-pay | Admitting: Hematology and Oncology

## 2022-12-05 ENCOUNTER — Telehealth: Payer: Self-pay

## 2022-12-05 NOTE — Telephone Encounter (Signed)
please notify patient that his iron levels are low. if he is agreeable, we can set him up with 1 dose of IV iron to bolster levels.  Pt advised and agreed to have IV iron. He is aware after insurance approval scheduling will call to set up the appt.  Can you also call [facility] and request a follow up with them to further evaluate underlying cause of iron deficiency appt 6/12 with Dr Gwendalyn Ege. Office note and labs faxed.

## 2022-12-06 ENCOUNTER — Telehealth: Payer: Self-pay | Admitting: Hematology and Oncology

## 2022-12-06 ENCOUNTER — Telehealth: Payer: Self-pay

## 2022-12-06 LAB — PROSTATE-SPECIFIC AG, SERUM (LABCORP): Prostate Specific Ag, Serum: <0.1 ng/mL (ref 0.0–4.0)

## 2022-12-06 NOTE — Telephone Encounter (Signed)
-----   Message from Briant Cedar, PA-C sent at 12/06/2022 11:23 AM EDT ----- Please notify patient that PSA level is still normal.

## 2022-12-06 NOTE — Telephone Encounter (Signed)
Pt advised with VU 

## 2022-12-10 ENCOUNTER — Inpatient Hospital Stay: Payer: Medicare HMO

## 2022-12-10 ENCOUNTER — Telehealth: Payer: Self-pay | Admitting: Physician Assistant

## 2022-12-17 ENCOUNTER — Other Ambulatory Visit: Payer: Self-pay | Admitting: *Deleted

## 2022-12-17 ENCOUNTER — Other Ambulatory Visit: Payer: Self-pay

## 2022-12-17 ENCOUNTER — Inpatient Hospital Stay: Payer: Medicare HMO

## 2022-12-17 DIAGNOSIS — C61 Malignant neoplasm of prostate: Secondary | ICD-10-CM

## 2022-12-17 MED ORDER — ENZALUTAMIDE 40 MG PO TABS
160.0000 mg | ORAL_TABLET | Freq: Every day | ORAL | 3 refills | Status: DC
Start: 2022-12-17 — End: 2023-04-06

## 2022-12-24 ENCOUNTER — Inpatient Hospital Stay: Payer: Medicare HMO

## 2023-01-03 ENCOUNTER — Encounter: Payer: Self-pay | Admitting: Hematology and Oncology

## 2023-02-17 ENCOUNTER — Inpatient Hospital Stay: Payer: Medicare HMO

## 2023-02-17 ENCOUNTER — Inpatient Hospital Stay: Payer: Medicare HMO | Admitting: Hematology and Oncology

## 2023-03-05 ENCOUNTER — Inpatient Hospital Stay: Payer: Medicare HMO

## 2023-03-05 ENCOUNTER — Other Ambulatory Visit: Payer: Self-pay | Admitting: Hematology and Oncology

## 2023-03-05 ENCOUNTER — Inpatient Hospital Stay: Payer: Medicare HMO | Attending: Nurse Practitioner

## 2023-03-05 ENCOUNTER — Inpatient Hospital Stay: Payer: Medicare HMO | Admitting: Hematology and Oncology

## 2023-03-05 DIAGNOSIS — D509 Iron deficiency anemia, unspecified: Secondary | ICD-10-CM

## 2023-03-05 DIAGNOSIS — C61 Malignant neoplasm of prostate: Secondary | ICD-10-CM

## 2023-03-05 NOTE — Progress Notes (Deleted)
[facility] Telephone:([phone]) [phone]   Fax:(336) [phone]  PROGRESS NOTE  Patient Care Team: Knox Royalty, MD as PCP - General (Family Medicine)  Hematological/Oncological History # Castration Sensitive Advance Prostate Cancer 2013: radiation therapy in [place]: prostate biopsy showed a Gleason score 4+3 = 7 as well as a another foci of Gleason score 4+4 = 8. He underwent a repeat cryotherapy  12/01/2015: PSA went up to 2.96 and a PET scan obtained, showed marked hypermetabolic right pelvic sidewall lymph node consistent with metastatic disease  12/15/2015: biopsy confirmed the presence of metastatic prostate cancer  July 2017: started zytiga 1000 mg  03/14/2022: last visit with Dr. Clelia Croft  06/12/2022: Transition to enzalutamide 160 mg p.o. daily due to insurance issues. 08/14/2022: transition care to Dr. Leonides Schanz   Interval History:  James [name] 77 y.o. male with medical history significant for [facility] who presents for a follow up visit. The patient's last visit was on 08/14/2022. In the interim since the last visit he has continued on enzalutamide and lupron injections.   On exam today Mr. James [name] notes that he is feeling well without any new or concerning symptoms. His energy is stable and he can complete his ADLs on his own. He has a good appetite without any weight changes. He denies any nausea, vomiting or abdominal pain. He denies any signs of bleeding including hematochezia or melena. He denies any new bone or back pain.  He otherwise denies any fevers, chills, sweats, nausea, vomiting or diarrhea.  A full 10 point ROS was otherwise negative.  MEDICAL HISTORY:  Past Medical History:  Diagnosis Date   GERD (gastroesophageal reflux disease)    Hyperlipidemia    Hypertension    Nocturia    Recurrent prostate adenocarcinoma (HCC)    first dx'd-- 2010  post external beam radiation--  recurrent march 2015  s/p cryoablation (in [place])   Wears glasses      SURGICAL HISTORY: Past Surgical History:  Procedure Laterality Date   COLONOSCOPY  last one 2014   CRYOABLATION N/A 12/05/2014   Procedure: CRYO ABLATION PROSTATE;  Surgeon: Jethro Bolus, MD;  Location: [facility] [place];  Service: Urology;  Laterality: N/A;   PROSTATE CRYOABLATION  March 2015  ([place])   REPAIR RIGHT RING AND LITTLE FINGERS  2014    post numbness residual    SOCIAL HISTORY: Social History   Socioeconomic History   Marital status: Single    Spouse name: Not on file   Number of children: Not on file   Years of education: Not on file   Highest education level: Not on file  Occupational History   Not on file  Tobacco Use   Smoking status: Former    Current packs/day: 0.00    Average packs/day: 1 pack/day for 40.0 years (40.0 ttl pk-yrs)    Types: Cigarettes    Start date: 11/29/1972    Quit date: 11/29/2012    Years since quitting: 10.2   Smokeless tobacco: Never  Substance and Sexual Activity   Alcohol use: No   Drug use: No   Sexual activity: Not on file  Other Topics Concern   Not on file  Social History Narrative   Not on file   Social Determinants of Health   Financial Resource Strain: Not on file  Food Insecurity: Not on file  Transportation Needs: Not on file  Physical Activity: Not on file  Stress: Not on file  Social Connections: Not on file  Intimate Partner  Violence: Not on file    FAMILY HISTORY: No family history on file.  ALLERGIES:  has No Known Allergies.  MEDICATIONS:  Current Outpatient Medications  Medication Sig Dispense Refill   amLODipine (NORVASC) 10 MG tablet Take 10 mg by mouth daily.     atorvastatin (LIPITOR) 40 MG tablet      enzalutamide (XTANDI) 40 MG tablet Take 4 tablets (160 mg total) by mouth daily. 120 tablet 3   ferrous sulfate 325 (65 FE) MG EC tablet Take 1 tablet (325 mg total) by mouth 2 (two) times daily. (Patient taking differently: Take 325 mg by mouth daily.) 60 tablet 3    ibuprofen (ADVIL) 200 MG tablet Take 200 mg by mouth every 6 (six) hours as needed for headache.     metoprolol succinate (TOPROL-XL) 25 MG 24 hr tablet Take 25 mg by mouth daily.     oxybutynin (DITROPAN-XL) 10 MG 24 hr tablet Take 10 mg by mouth at bedtime. ER     No current facility-administered medications for this visit.    REVIEW OF SYSTEMS:   Constitutional: ( - ) fevers, ( - )  chills , ( - ) night sweats Eyes: ( - ) blurriness of vision, ( - ) double vision, ( - ) watery eyes Ears, nose, mouth, throat, and face: ( - ) mucositis, ( - ) sore throat Respiratory: ( - ) cough, ( - ) dyspnea, ( - ) wheezes Cardiovascular: ( - ) palpitation, ( - ) chest discomfort, ( - ) lower extremity swelling Gastrointestinal:  ( - ) nausea, ( - ) heartburn, ( - ) change in bowel habits Skin: ( - ) abnormal skin rashes Lymphatics: ( - ) new lymphadenopathy, ( - ) easy bruising Neurological: ( - ) numbness, ( - ) tingling, ( - ) new weaknesses Behavioral/Psych: ( - ) mood change, ( - ) new changes  All other systems were reviewed with the patient and are negative.  PHYSICAL EXAMINATION: There were no vitals filed for this visit.  There were no vitals filed for this visit.   GENERAL: Well-appearing elderly African-American male, alert, no distress and comfortable SKIN: skin color, texture, turgor are normal, no rashes or significant lesions EYES: conjunctiva are pink and non-injected, sclera clear LUNGS: clear to auscultation and percussion with normal breathing effort HEART: regular rate & rhythm and no murmurs and no lower extremity edema Musculoskeletal: no cyanosis of digits and no clubbing  PSYCH: alert & oriented x 3, fluent speech NEURO: no focal motor/sensory deficits  LABORATORY DATA:  I have reviewed the data as listed    Latest Ref Rng & Units 12/04/2022   12:30 PM 08/14/2022    3:12 PM 07/25/2022   11:34 AM  CBC  WBC 4.0 - 10.5 K/uL 4.6  4.7  4.5   Hemoglobin 13.0 - 17.0 g/dL  16.1  09.6  04.5   Hematocrit 39.0 - 52.0 % 37.3  41.6  42.5   Platelets 150 - 400 K/uL 170  192  174        Latest Ref Rng & Units 12/04/2022   12:30 PM 08/14/2022    3:12 PM 07/25/2022   11:34 AM  CMP  Glucose 70 - 99 mg/dL 91  409  811   BUN 8 - 23 mg/dL 22  22  16    Creatinine 0.61 - 1.24 mg/dL 9.14  7.82  9.56   Sodium 135 - 145 mmol/L 139  135  137   Potassium 3.5 -  5.1 mmol/L 4.3  4.5  4.0   Chloride 98 - 111 mmol/L 107  100  103   CO2 22 - 32 mmol/L 25  22  26    Calcium 8.9 - 10.3 mg/dL 56.2  13.0  9.9   Total Protein 6.5 - 8.1 g/dL 7.9  8.0    Total Bilirubin 0.3 - 1.2 mg/dL 0.3  0.6    Alkaline Phos 38 - 126 U/L 70  69    AST 15 - 41 U/L 27  30    ALT 0 - 44 U/L 18  20     RADIOGRAPHIC STUDIES: No results found.  ASSESSMENT & PLAN Baby H Sanjurjo 77 y.o. male with medical history significant for [facility] who presents for a follow up visit.  # Castration Sensitive Advance Prostate Cancer --continue enzalutamide 160 mg PO daily  --continue leuprolide 22.5 mg q. 3 months --lab today show white blood cell 4.6, hemoglobin 13.0, MCV 89.0, and platelets of 170 --most recent PSA <0.1 on 08/14/2022. Today's pending.  --RTC in 3 months with labs   #Iron deficiency anemia: --Etiology unknown. Patient denies any overt signs of bleeding. He does have history of H.pylori infection and has completed therapy.  --Will request follow up with [facility] to further evaluate underlying cause.  --Currently taking PO ferrous sulfate 325 mg PO daily. Advised to take in the morning with a source of vitamin C.  --Labs today show no anemia with Hgb 13.0. Iron panel shows deficiency with saturation 14%, ferritin 21. --Will arrange for IV venofer 200 mg x 2 doses to bolster iron levels.   No orders of the defined types were placed in this encounter.   All questions were answered. The patient knows to call the clinic with any problems, questions or concerns.  I have spent a total of 30 minutes  minutes of face-to-face and non-face-to-face time, preparing to see the patient, performing a medically appropriate examination, counseling and educating the patient,  documenting clinical information in the electronic health record, and care coordination.   Ulysees Barns, MD Department of Hematology/Oncology [facility] at [facility] Phone: [phone] Pager: [phone] Email: [email or web address]    03/05/2023 7:30 AM

## 2023-03-20 ENCOUNTER — Telehealth: Payer: Self-pay | Admitting: Hematology and Oncology

## 2023-03-24 ENCOUNTER — Telehealth: Payer: Self-pay | Admitting: Hematology and Oncology

## 2023-03-25 ENCOUNTER — Inpatient Hospital Stay: Payer: Medicare HMO

## 2023-03-25 ENCOUNTER — Inpatient Hospital Stay: Payer: Medicare HMO | Admitting: Hematology and Oncology

## 2023-04-01 ENCOUNTER — Telehealth: Payer: Self-pay | Admitting: Hematology and Oncology

## 2023-04-03 ENCOUNTER — Telehealth: Payer: Self-pay | Admitting: Hematology and Oncology

## 2023-04-04 ENCOUNTER — Inpatient Hospital Stay: Payer: Medicare HMO | Admitting: Hematology and Oncology

## 2023-04-04 ENCOUNTER — Inpatient Hospital Stay: Payer: Medicare HMO

## 2023-04-04 ENCOUNTER — Inpatient Hospital Stay: Payer: Medicare HMO | Attending: Nurse Practitioner

## 2023-04-04 ENCOUNTER — Inpatient Hospital Stay: Payer: Medicare HMO | Admitting: Physician Assistant

## 2023-04-04 ENCOUNTER — Other Ambulatory Visit: Payer: Self-pay | Admitting: Hematology and Oncology

## 2023-04-04 VITALS — BP 117/74 | HR 56 | Temp 97.7°F | Resp 17 | Wt 187.9 lb

## 2023-04-04 DIAGNOSIS — I10 Essential (primary) hypertension: Secondary | ICD-10-CM | POA: Insufficient documentation

## 2023-04-04 DIAGNOSIS — Z923 Personal history of irradiation: Secondary | ICD-10-CM | POA: Diagnosis not present

## 2023-04-04 DIAGNOSIS — E785 Hyperlipidemia, unspecified: Secondary | ICD-10-CM | POA: Diagnosis not present

## 2023-04-04 DIAGNOSIS — K219 Gastro-esophageal reflux disease without esophagitis: Secondary | ICD-10-CM | POA: Diagnosis not present

## 2023-04-04 DIAGNOSIS — D509 Iron deficiency anemia, unspecified: Secondary | ICD-10-CM

## 2023-04-04 DIAGNOSIS — C61 Malignant neoplasm of prostate: Secondary | ICD-10-CM | POA: Diagnosis present

## 2023-04-04 DIAGNOSIS — Z191 Hormone sensitive malignancy status: Secondary | ICD-10-CM | POA: Diagnosis not present

## 2023-04-04 DIAGNOSIS — Z79899 Other long term (current) drug therapy: Secondary | ICD-10-CM | POA: Insufficient documentation

## 2023-04-04 DIAGNOSIS — Z87891 Personal history of nicotine dependence: Secondary | ICD-10-CM | POA: Insufficient documentation

## 2023-04-04 LAB — CBC WITH DIFFERENTIAL (CANCER CENTER ONLY)
Abs Immature Granulocytes: 0.01 10*3/uL (ref 0.00–0.07)
Basophils Absolute: 0 10*3/uL (ref 0.0–0.1)
Basophils Relative: 1 %
Eosinophils Absolute: 0.1 10*3/uL (ref 0.0–0.5)
Eosinophils Relative: 3 %
HCT: 43.4 % (ref 39.0–52.0)
Hemoglobin: 14.8 g/dL (ref 13.0–17.0)
Immature Granulocytes: 0 %
Lymphocytes Relative: 31 %
Lymphs Abs: 1.4 10*3/uL (ref 0.7–4.0)
MCH: 31.9 pg (ref 26.0–34.0)
MCHC: 34.1 g/dL (ref 30.0–36.0)
MCV: 93.5 fL (ref 80.0–100.0)
Monocytes Absolute: 0.6 10*3/uL (ref 0.1–1.0)
Monocytes Relative: 14 %
Neutro Abs: 2.3 10*3/uL (ref 1.7–7.7)
Neutrophils Relative %: 51 %
Platelet Count: 157 10*3/uL (ref 150–400)
RBC: 4.64 MIL/uL (ref 4.22–5.81)
RDW: 14.1 % (ref 11.5–15.5)
WBC Count: 4.5 10*3/uL (ref 4.0–10.5)
nRBC: 0 % (ref 0.0–0.2)

## 2023-04-04 LAB — IRON AND IRON BINDING CAPACITY (CC-WL,HP ONLY)
Iron: 70 ug/dL (ref 45–182)
Saturation Ratios: 18 % (ref 17.9–39.5)
TIBC: 389 ug/dL (ref 250–450)
UIBC: 319 ug/dL (ref 117–376)

## 2023-04-04 LAB — CMP (CANCER CENTER ONLY)
ALT: 17 U/L (ref 0–44)
AST: 25 U/L (ref 15–41)
Albumin: 4.1 g/dL (ref 3.5–5.0)
Alkaline Phosphatase: 74 U/L (ref 38–126)
Anion gap: 7 (ref 5–15)
BUN: 21 mg/dL (ref 8–23)
CO2: 25 mmol/L (ref 22–32)
Calcium: 10.4 mg/dL — ABNORMAL HIGH (ref 8.9–10.3)
Chloride: 106 mmol/L (ref 98–111)
Creatinine: 1.22 mg/dL (ref 0.61–1.24)
GFR, Estimated: >60 mL/min (ref >60)
Glucose, Bld: 91 mg/dL (ref 70–99)
Potassium: 4.1 mmol/L (ref 3.5–5.1)
Sodium: 138 mmol/L (ref 135–145)
Total Bilirubin: 0.5 mg/dL (ref 0.3–1.2)
Total Protein: 8.3 g/dL — ABNORMAL HIGH (ref 6.5–8.1)

## 2023-04-04 LAB — RETIC PANEL
Immature Retic Fract: 12.3 % (ref 2.3–15.9)
RBC.: 4.6 MIL/uL (ref 4.22–5.81)
Retic Count, Absolute: 58.4 10*3/uL (ref 19.0–186.0)
Retic Ct Pct: 1.3 % (ref 0.4–3.1)
Reticulocyte Hemoglobin: 35.5 pg (ref >27.9)

## 2023-04-04 LAB — FERRITIN: Ferritin: 79 ng/mL (ref 24–336)

## 2023-04-04 MED ORDER — LEUPROLIDE ACETATE (3 MONTH) 22.5 MG IM KIT
22.5000 mg | PACK | Freq: Once | INTRAMUSCULAR | Status: AC
  Administered 2023-04-04: 22.5 mg via INTRAMUSCULAR
  Filled 2023-04-04: qty 22.5

## 2023-04-04 NOTE — Progress Notes (Unsigned)
[facility] [phone]   [phone]  PROGRESS NOTE  Patient Care Team: Knox Royalty, MD as PCP - General (Family Medicine)  Hematological/Oncological History # Castration Sensitive Advance Prostate Cancer 2013: radiation therapy in [place]: prostate biopsy showed a Gleason score 4+3 = 7 as well as a another foci of Gleason score 4+4 = 8. He underwent a repeat cryotherapy  12/01/2015: PSA went up to 2.96 and a PET scan obtained, showed marked hypermetabolic right pelvic sidewall lymph node consistent with metastatic disease  12/15/2015: biopsy confirmed the presence of metastatic prostate cancer  July 2017: started zytiga 1000 mg  03/14/2022: last visit with Dr. Clelia Croft  06/12/2022: Transition to enzalutamide 160 mg p.o. daily due to insurance issues. 08/14/2022: transition care to Dr. Leonides Schanz   Interval History:  James [name] 77 y.o. male with medical history significant for [facility] who presents for a follow up visit. The patient's last visit was on 12/04/2022. In the interim since the last visit he has continued on enzalutamide and lupron injections. He is accompanied by his daughter, James [name], for this visit.   On exam today Mr. Wiltgen reports that he is doing well and tolerating his treatment. His energy and appetite are stable. He is able to complete his ADLs at baseline. He denies any nausea, vomiting or abdominal pain. His bowel habits are unchanged without recurrent episodes of diarrhea or constipation. He denies any signs of bleeding including hematochezia or melena. He denies any new bone or back pain.  He otherwise denies any fevers, chills, sweats, shortness of breath, chest pain or cough.  A full 10 point ROS was otherwise negative.  MEDICAL HISTORY:  Past Medical History:  Diagnosis Date   GERD (gastroesophageal reflux disease)    Hyperlipidemia    Hypertension    Nocturia    Recurrent prostate adenocarcinoma (HCC)    first dx'd--  2010  post external beam radiation--  recurrent march 2015  s/p cryoablation (in [place])   Wears glasses     SURGICAL HISTORY: Past Surgical History:  Procedure Laterality Date   COLONOSCOPY  last one 2014   CRYOABLATION N/A 12/05/2014   Procedure: CRYO ABLATION PROSTATE;  Surgeon: Jethro Bolus, MD;  Location: [facility] [place];  Service: Urology;  Laterality: N/A;   PROSTATE CR[place]ATION  March 2015  ([place]   REPAIR RIGHT RING AND LITTLE FINGERS  2014    post numbness residual    SOCIAL HISTORY: Social History   Socioeconomic History   Marital status: Single    Spouse name: Not on file   Number of children: Not on file   Years of education: Not on file   Highest education level: Not on file  Occupational History   Not on file  Tobacco Use   Smoking status: Former    Current packs/day: 0.00    Average packs/day: 1 pack/day for 40.0 years (40.0 ttl pk-yrs)    Types: Cigarettes    Start date: 11/29/1972    Quit date: 11/29/2012    Years since quitting: 10.3   Smokeless tobacco: Never  Substance and Sexual Activity   Alcohol use: No   Drug use: No   Sexual activity: Not on file  Other Topics Concern   Not on file  Social History Narrative   Not on file   Social Determinants of Health   Financial Resource Strain: Not on file  Food Insecurity: Not on file  Transportation Needs: Not on file  Physical Activity: Not  on file  Stress: Not on file  Social Connections: Not on file  Intimate Partner Violence: Not on file    FAMILY HISTORY: No family history on file.  ALLERGIES:  has No Known Allergies.  MEDICATIONS:  Current Outpatient Medications  Medication Sig Dispense Refill   amLODipine (NORVASC) 10 MG tablet Take 10 mg by mouth daily.     atorvastatin (LIPITOR) 40 MG tablet      enzalutamide (XTANDI) 40 MG tablet Take 4 tablets (160 mg total) by mouth daily. 120 tablet 3   ferrous sulfate 325 (65 FE) MG EC tablet Take 1 tablet (325 mg  total) by mouth 2 (two) times daily. (Patient taking differently: Take 325 mg by mouth daily.) 60 tablet 3   ibuprofen (ADVIL) 200 MG tablet Take 200 mg by mouth every 6 (six) hours as needed for headache.     metoprolol succinate (TOPROL-XL) 25 MG 24 hr tablet Take 25 mg by mouth daily.     oxybutynin (DITROPAN-XL) 10 MG 24 hr tablet Take 10 mg by mouth at bedtime. ER     No current facility-administered medications for this visit.    REVIEW OF SYSTEMS:   Constitutional: ( - ) fevers, ( - )  chills , ( - ) night sweats Eyes: ( - ) blurriness of vision, ( - ) double vision, ( - ) watery eyes Ears, nose, mouth, throat, and face: ( - ) mucositis, ( - ) sore throat Respiratory: ( - ) cough, ( - ) dyspnea, ( - ) wheezes Cardiovascular: ( - ) palpitation, ( - ) chest discomfort, ( - ) lower extremity swelling Gastrointestinal:  ( - ) nausea, ( - ) heartburn, ( - ) change in bowel habits Skin: ( - ) abnormal skin rashes Lymphatics: ( - ) new lymphadenopathy, ( - ) easy bruising Neurological: ( - ) numbness, ( - ) tingling, ( - ) new weaknesses Behavioral/Psych: ( - ) mood change, ( - ) new changes  All other systems were reviewed with the patient and are negative.  PHYSICAL EXAMINATION: Vitals:   04/04/23 1134  BP: 117/74  Pulse: (!) 56  Resp: 17  Temp: 97.7 F (36.5 C)  SpO2: 98%    Filed Weights   04/04/23 1134  Weight: 187 lb 14.4 oz (85.2 kg)     GENERAL: Well-appearing elderly African-American male, alert, no distress and comfortable SKIN: skin color, texture, turgor are normal, no rashes or significant lesions EYES: conjunctiva are pink and non-injected, sclera clear LUNGS: clear to auscultation and percussion with normal breathing effort HEART: regular rate & rhythm and no murmurs and no lower extremity edema Musculoskeletal: no cyanosis of digits and no clubbing  PSYCH: alert & oriented x 3, fluent speech NEURO: no focal motor/sensory deficits  LABORATORY DATA:  I  have reviewed the data as listed    Latest Ref Rng & Units 04/04/2023   11:15 AM 12/04/2022   12:30 PM 08/14/2022    3:12 PM  CBC  WBC 4.0 - 10.5 K/uL 4.5  4.6  4.7   Hemoglobin 13.0 - 17.0 g/dL 16.1  09.6  04.5   Hematocrit 39.0 - 52.0 % 43.4  37.3  41.6   Platelets 150 - 400 K/uL 157  170  192        Latest Ref Rng & Units 12/04/2022   12:30 PM 08/14/2022    3:12 PM 07/25/2022   11:34 AM  CMP  Glucose 70 - 99 mg/dL 91  409  102   BUN 8 - 23 mg/dL 22  22  16    Creatinine 0.61 - 1.24 mg/dL 0.98  1.19  1.47   Sodium 135 - 145 mmol/L 139  135  137   Potassium 3.5 - 5.1 mmol/L 4.3  4.5  4.0   Chloride 98 - 111 mmol/L 107  100  103   CO2 22 - 32 mmol/L 25  22  26    Calcium 8.9 - 10.3 mg/dL 82.9  56.2  9.9   Total Protein 6.5 - 8.1 g/dL 7.9  8.0    Total Bilirubin 0.3 - 1.2 mg/dL 0.3  0.6    Alkaline Phos 38 - 126 U/L 70  69    AST 15 - 41 U/L 27  30    ALT 0 - 44 U/L 18  20     RADIOGRAPHIC STUDIES: No results found.  ASSESSMENT & PLAN KIEN MIRSKY is a 77 y.o. male with medical history significant for [facility] who presents for a follow up visit.  # Castration Sensitive Advance Prostate Cancer --lab today show white blood cell 4.5, hemoglobin 14.8, MCV 93.5, and platelets of 157. PSA level is < 0.1.  --continue enzalutamide 160 mg PO daily  --continue leuprolide 22.5 mg q. 3 months --RTC in 3 months with labs   #Iron deficiency anemia: --Etiology unknown. Patient denies any overt signs of bleeding. He does have history of H.pylori infection and has completed therapy.  --Labs today show no anemia with Hgb 14.8. Iron panel shows no deficiency with saturation 18%, ferritin 79.. --Currently taking PO ferrous sulfate 325 mg PO daily. Advised to continue.    No orders of the defined types were placed in this encounter.   All questions were answered. The patient knows to call the clinic with any problems, questions or concerns.  I have spent a total of 30 minutes minutes of  face-to-face and non-face-to-face time, preparing to see the patient, performing a medically appropriate examination, counseling and educating the patient,  documenting clinical information in the electronic health record, and care coordination.   Georga Kaufmann PA-C Dept of Hematology and Oncology [facility] Phone: [phone]     04/04/2023 11:43 AM

## 2023-04-04 NOTE — Patient Instructions (Signed)
Leuprolide Solution for Injection What is this medication? LEUPROLIDE (loo PROE lide) reduces the symptoms of prostate cancer. It works by decreasing levels of the hormone testosterone in the body. This prevents prostate cancer cells from spreading or growing. This medicine may be used for other purposes; ask your health care provider or pharmacist if you have questions. COMMON BRAND NAME(S): Lupron What should I tell my care team before I take this medication? They need to know if you have any of these conditions: Diabetes Heart attack Heart disease High blood pressure High cholesterol Pain or difficulty passing urine Spinal cord metastasis Stroke Tobacco use An unusual or allergic reaction to leuprolide, other medications, foods, dyes, or preservatives Pregnant or trying to get pregnant Breast-feeding How should I use this medication? This medication is for injection under the skin or into a muscle. You will be taught how to prepare and give this medication. Use exactly as directed. Take your medication at regular intervals. Do not take it more often than directed. It is important that you put your used needles and syringes in a special sharps container. Do not put them in a trash can. If you do not have a sharps container, call your care team to get one. A special MedGuide will be given to you by the pharmacist with each prescription and refill. Be sure to read this information carefully each time. Talk to your care team about the use of this medication in children. While this medication may be prescribed for children as young as 8 years for selected conditions, precautions do apply. Overdosage: If you think you have taken too much of this medicine contact a poison control center or emergency room at once. NOTE: This medicine is only for you. Do not share this medicine with others. What if I miss a dose? If you miss a dose, take it as soon as you can. If it is almost time for your next  dose, take only that dose. Do not take double or extra doses. What may interact with this medication? Do not take this medication with any of the following: Chasteberry Cisapride Dronedarone Pimozide Thioridazine This medication may also interact with the following: Estrogen or progestin hormones Herbal or dietary supplements, like black cohosh or DHEA Other medications that cause heart rhythm changes Testosterone This list may not describe all possible interactions. Give your health care provider a list of all the medicines, herbs, non-prescription drugs, or dietary supplements you use. Also tell them if you smoke, drink alcohol, or use illegal drugs. Some items may interact with your medicine. What should I watch for while using this medication? Visit your care team for regular checks on your progress. During the first week, your symptoms may get worse, but then will improve as you continue your treatment. You may get hot flashes, increased bone pain, increased difficulty passing urine, or an aggravation of nerve symptoms. Discuss these effects with your care team, some of them may improve with continued use of this medication. Patients may experience a menstrual cycle or spotting during the first 2 months of therapy with this medication. If this continues, contact your care team. This medication may increase blood sugar. The risk may be higher in patients who already have diabetes. Ask your care team what you can do to lower your risk of diabetes while taking this medication. What side effects may I notice from receiving this medication? Side effects that you should report to your care team as soon as possible: Allergic reactions--skin rash,  itching, hives, swelling of the face, lips, tongue, or throat Heart attack--pain or tightness in the chest, shoulders, arms, or jaw, nausea, shortness of breath, cold or clammy skin, feeling faint or lightheaded Heart rhythm changes--fast or irregular  heartbeat, dizziness, feeling faint or lightheaded, chest pain, trouble breathing High blood sugar (hyperglycemia)--increased thirst or amount of urine, unusual weakness or fatigue, blurry vision New or worsening seizures Redness, blistering, peeling, or loosening of the skin, including inside the mouth Stroke--sudden numbness or weakness of the face, arm, or leg, trouble speaking, confusion, trouble walking, loss of balance or coordination, dizziness, severe headache, change in vision Swelling and pain of the tumor site or lymph nodes Side effects that usually do not require medical attention (report these to your care team if they continue or are bothersome): Change in sex drive or performance Hot flashes Joint pain Pain, redness, or irritation at injection site Swelling of the ankles, hands, or feet Unusual weakness or fatigue This list may not describe all possible side effects. Call your doctor for medical advice about side effects. You may report side effects to FDA at 1-800-FDA-1088. Where should I keep my medication? Keep out of the reach of children and pets. Store below 25 degrees C (77 degrees F). Do not freeze. Protect from light. Get rid of any unused medication after the expiration date. To get rid of medications that are no longer needed or have expired: Take the medication to a medication take-back program. Check with your pharmacy or law enforcement to find a location. If you cannot return the medication, ask your pharmacist or care team how to get rid of this medication safely. NOTE: This sheet is a summary. It may not cover all possible information. If you have questions about this medicine, talk to your doctor, pharmacist, or health care provider.  2024 Elsevier/Gold Standard (2022-11-05 00:00:00)

## 2023-04-05 LAB — PROSTATE-SPECIFIC AG, SERUM (LABCORP): Prostate Specific Ag, Serum: <0.1 ng/mL (ref 0.0–4.0)

## 2023-04-05 LAB — TESTOSTERONE: Testosterone: 44 ng/dL — ABNORMAL LOW (ref 264–916)

## 2023-04-06 ENCOUNTER — Encounter: Payer: Self-pay | Admitting: Hematology and Oncology

## 2023-04-07 ENCOUNTER — Encounter: Payer: Self-pay | Admitting: Hematology and Oncology

## 2023-04-07 ENCOUNTER — Telehealth: Payer: Self-pay

## 2023-04-07 NOTE — Telephone Encounter (Signed)
LM for pt's daughter, Carlena Sax, with lab results and recommendations

## 2023-04-07 NOTE — Telephone Encounter (Signed)
can you notify patient and his daughter James [name] that PSA levels are still normal  Also iron levels are normal. No need for IV iron but continue on iron pills at this time   Pt advised and voiced understanding

## 2023-04-23 ENCOUNTER — Other Ambulatory Visit: Payer: Self-pay | Admitting: *Deleted

## 2023-04-23 ENCOUNTER — Telehealth: Payer: Self-pay | Admitting: Pharmacy Technician

## 2023-04-23 DIAGNOSIS — C61 Malignant neoplasm of prostate: Secondary | ICD-10-CM

## 2023-04-23 MED ORDER — ENZALUTAMIDE 40 MG PO TABS: 160.0000 mg | ORAL_TABLET | Freq: Every day | ORAL | 2 refills | Status: DC

## 2023-04-23 NOTE — Telephone Encounter (Signed)
Oral Oncology Patient Advocate Encounter   Received notification that patient is due for re-enrollment for assistance for Xtandi through [employer].   Re-enrollment process requires new e-rx be sent to [facility]  [facility] phone number [phone].   I will continue to follow until final determination.  Jinger Neighbors, CPhT-Adv Oncology Pharmacy Patient Advocate [facility] Direct Number: [phone]  Fax: [phone]

## 2023-04-24 NOTE — Telephone Encounter (Signed)
Oral Oncology Patient Advocate Encounter   Received notification re-enrollment for assistance for Xtandi through [employer] has been approved. Patient may continue to receive their medication at $0 from this program.    [employer] phone number [phone].   Effective dates: 07/02/23 through 06/30/24  I have spoken to the patient.  Jinger Neighbors, CPhT-Adv Oncology Pharmacy Patient Advocate [facility] Direct Number: [phone]  Fax: [phone]

## 2023-05-05 ENCOUNTER — Other Ambulatory Visit: Payer: Self-pay | Admitting: *Deleted

## 2023-05-05 MED ORDER — OXYBUTYNIN CHLORIDE ER 10 MG PO TB24: 10.0000 mg | ORAL_TABLET | Freq: Every day | ORAL | 3 refills | Status: AC

## 2023-05-26 ENCOUNTER — Telehealth: Payer: Self-pay | Admitting: *Deleted

## 2023-05-26 NOTE — Telephone Encounter (Signed)
Received message from oncall staff. Pt called regarding his nxt appt. TCT patient and spoke with him. Advised that his next appt is 07/16/23 @ 2:45 pm. Advised  that he will have labs, clinic visit and lupron injection that day. Pt voiced understanding. Asked pt if he has been taking his new medication, Xtandi. He states he is taking it every day and so far he feels ok No other questions or concerns.

## 2023-05-27 ENCOUNTER — Telehealth: Payer: Self-pay | Admitting: Hematology and Oncology

## 2023-06-16 ENCOUNTER — Other Ambulatory Visit: Payer: Self-pay | Admitting: *Deleted

## 2023-06-16 DIAGNOSIS — C61 Malignant neoplasm of prostate: Secondary | ICD-10-CM

## 2023-06-16 MED ORDER — ENZALUTAMIDE 40 MG PO TABS: 160.0000 mg | ORAL_TABLET | Freq: Every day | ORAL | 2 refills | Status: DC

## 2023-07-09 ENCOUNTER — Encounter: Payer: Self-pay | Admitting: Hematology and Oncology

## 2023-07-14 ENCOUNTER — Encounter: Payer: Self-pay | Admitting: Hematology and Oncology

## 2023-07-14 ENCOUNTER — Telehealth: Payer: Self-pay | Admitting: Hematology and Oncology

## 2023-07-16 ENCOUNTER — Inpatient Hospital Stay: Payer: Medicare HMO

## 2023-07-16 ENCOUNTER — Inpatient Hospital Stay: Payer: Medicare HMO | Admitting: Hematology and Oncology

## 2023-07-23 ENCOUNTER — Telehealth: Payer: Self-pay | Admitting: Neurology

## 2023-07-23 ENCOUNTER — Encounter: Payer: Self-pay | Admitting: Neurology

## 2023-07-23 ENCOUNTER — Ambulatory Visit (INDEPENDENT_AMBULATORY_CARE_PROVIDER_SITE_OTHER): Payer: Medicare (Managed Care) | Admitting: Neurology

## 2023-07-23 VITALS — BP 135/79 | HR 68 | Ht 70.0 in | Wt 186.0 lb

## 2023-07-23 DIAGNOSIS — R413 Other amnesia: Secondary | ICD-10-CM

## 2023-07-23 DIAGNOSIS — G4733 Obstructive sleep apnea (adult) (pediatric): Secondary | ICD-10-CM | POA: Diagnosis not present

## 2023-07-23 NOTE — Progress Notes (Addendum)
Subjective:    Patient ID: James [name] is a 78 y.o. male.  HPI    Huston Foley, MD, PhD [facility] [address] [address] [place], [place] [address]  Dear Dr. Yetta Barre,  I saw your patient, James [name], upon your kind request in my neurologic clinic today for evaluation of his memory loss.  The patient is unaccompanied today, his daughter Dorene Grebe is on speaker phone and also communicated with her sister with messaging during the appointment.  As you know, [name] is a 78 year old male with an underlying medical history of left carotid artery stenosis, bradycardia, aortic atherosclerosis, reflux disease, hypertension, hyperlipidemia, obstructive sleep apnea, prostate cancer, mildly overweight state and recurrent headaches, who reports a several month history of short term memory issues, including improvement.  No significant confusion is reported.  He denies any recurrent headaches.  He denies any strokelike symptoms such as sudden onset of one-sided weakness or numbness or tingling or droopy face or slurring of speech, denies any recent falls.  I reviewed your office note from 04/04/2023.  He reported short-term memory impairment at the time.  He did not have any imaging test or blood work at the time.  He had some blood work through oncology on 04/04/2023 which I reviewed in his electronic chart.  I had evaluated him for headaches several years ago.  He was advised to proceed with a sleep study and a brain MRI at the time.  He was advised to be cautious with the use of Fioricet.  He was advised to proceed with a full eye examination as he reported not having had an eye exam in about 2 years at the time.  He had a brain MRI with and without contrast on 07/07/2019 which showed:   IMPRESSION:    MRI brain (with and without) demonstrating: - Moderate periventricular and subcortical chronic small vessel ischemic disease.  - No acute findings.  In addition, I  personally and independently reviewed images through the PACS system.  He had a home sleep test through our office on 06/22/2019 which showed a total AHI of 28.8/h, O2 nadir 89%.  He was advised to proceed with home AutoPap therapy.  He canceled subsequent appointments and did not get set up to on home AutoPap therapy.    He had a head CT without contrast through [facility] emergency department on 07/25/2022 with indication of headache.  I reviewed the results:  IMPRESSION: 1. No acute intracranial abnormality. 2. Sequela of severe chronic microvascular ischemic change, progressed compared to 2021.   In addition, I personally and independently reviewed images through the PACS system.  He denies being on PAP therapy.  In fact, he does not recall having a sleep test done.  He lives alone, he is divorced.  He denies a family history of memory loss, he has 1 older sister without any memory issues and 7 younger siblings, none with memory issues.  He has not seen an eye doctor in years.  He is not sure when he had his last eye appointment his daughter is also not sure.  He quit smoking over 20 years ago, he does not drink caffeine daily, no alcohol currently.  He drinks about 3 glasses of water per day on average.  He does not drive and has not driven in years.  He took an [place] today to this appointment.   Previously (copied from previous notes for reference):    06/09/2019: 78 year old right-handed gentleman  with an underlying medical history of hypertension, hyperlipidemia, vitamin D deficiency, prostate cancer, history of syphilis, chronic pain, prior smoking, and overweight state, who reports recent headaches, for the past 1 month.  He has had mostly left-sided headaches.  He had symptomatic treatment with Fioricet, I reviewed your office note from 06/03/2019.  You ordered a head CT without contrast.  He he had a head CT without contrast on 06/04/2019 and I reviewed the results: Impression:  Age-related changes.  Findings consistent with small vessel atherosclerotic disease.  No acute abnormality.   He reports a dull, achy sensation, a constant pressure-like headache in the top of the head and sometimes in the left parietal and parietotemporal area, no tenderness to touch in the temple area.  No obvious triggers.  He does report that he does not sleep very well.  He lives alone, is divorced, has had a variable sleep pattern, sometimes goes to bed around 9 or 10 and sometimes much later.  He reports that he is on the Internet a lot.  His rise time is variable but he sets an alarm for 530 to take his prostate medication because he has to take it an hour before eating, sometimes he goes back to sleep for a little bit.  He has nocturia about 3 times per average night.  He denies any recurrent nocturnal or morning headaches.  He has an Epworth sleepiness score of 5 out of 24.  He snores a little he believes.  He does not have any pets in the household and does not have a TV in the bedroom.  He has not had any sudden onset of one-sided weakness or numbness or tingling or droopy face or slurring of speech.  He has no history of migraine headaches, headaches currently are not throbbing, no nausea or vomiting and no photophobia associated.  He has not had an eye examination in over 2 years.  He had prescription eyeglasses but does not use them.  He does not drink daily caffeine.  He does not drink daily alcohol.  He does not smoke. He tested positive for COVID-19 IgG antibodies.  He believes that he had an acute illness when he was traveling through [place] in January 2020 and he may have had an infection then.  His Past Medical History Is Significant For: Past Medical History:  Diagnosis Date   GERD (gastroesophageal reflux disease)    Hyperlipidemia    Hypertension    Nocturia    Recurrent prostate adenocarcinoma (HCC)    first dx'd-- 2010  post external beam radiation--  recurrent march 2015  s/p  cryoablation (in [place])   Wears glasses     His Past Surgical History Is Significant For: Past Surgical History:  Procedure Laterality Date   APPENDECTOMY  1970   COLONOSCOPY  last one 2014   CRYOABLATION N/A 12/05/2014   Procedure: CRYO ABLATION PROSTATE;  Surgeon: Jethro Bolus, MD;  Location: [facility];  Service: Urology;  Laterality: N/A;   PROSTATE [place]ATION  March 2015  ([place]   REPAIR RIGHT RING AND LITTLE FINGERS  07/01/2012    post numbness residual    His Family History Is Significant For: Family History  Problem Relation Age of Onset   Stroke Sister    Hyperlipidemia Sister    Hypertension Sister    Diabetes type II Sister     His Social History Is Significant For: Social History   Socioeconomic History   Marital status: Single    Spouse  name: Not on file   Number of children: Not on file   Years of education: Not on file   Highest education level: Not on file  Occupational History   Not on file  Tobacco Use   Smoking status: Former    Current packs/day: 0.00    Average packs/day: 1 pack/day for 40.0 years (40.0 ttl pk-yrs)    Types: Cigarettes    Start date: 11/29/1972    Quit date: 11/29/2012    Years since quitting: 10.6   Smokeless tobacco: Never  Substance and Sexual Activity   Alcohol use: No   Drug use: No   Sexual activity: Not on file  Other Topics Concern   Not on file  Social History Narrative   Not on file   Social Drivers of Health   Financial Resource Strain: Not on file  Food Insecurity: Not on file  Transportation Needs: Not on file  Physical Activity: Not on file  Stress: Not on file  Social Connections: Not on file    His Allergies Are:  No Known Allergies:   His Current Medications Are:  Outpatient Encounter Medications as of 07/23/2023  Medication Sig   amLODipine (NORVASC) 10 MG tablet Take 10 mg by mouth daily.   atorvastatin (LIPITOR) 40 MG tablet    enzalutamide (XTANDI) 40 MG  tablet Take 4 tablets (160 mg total) by mouth daily.   ferrous sulfate 325 (65 FE) MG EC tablet Take 1 tablet (325 mg total) by mouth 2 (two) times daily. (Patient taking differently: Take 325 mg by mouth daily.)   ibuprofen (ADVIL) 200 MG tablet Take 200 mg by mouth every 6 (six) hours as needed for headache.   metoprolol succinate (TOPROL-XL) 25 MG 24 hr tablet Take 25 mg by mouth daily.   oxybutynin (DITROPAN-XL) 10 MG 24 hr tablet Take 1 tablet (10 mg total) by mouth at bedtime. ER   No facility-administered encounter medications on file as of 07/23/2023.  :   Review of Systems:  Out of a complete 14 point review of systems, all are reviewed and negative with the exception of these symptoms as listed below:  Review of Systems  Neurological:        Patient in room #9 with his daughter on the phone. Patient states he doesn't remember coming here for a Sleep study or anything about a CPAP machine. Patient states he's hasn't had any headaches in a long time. Patient states he's here to discuss his remember issues.    Objective:  Neurological Exam  Physical Exam Physical Examination:   Vitals:   07/23/23 1407  BP: 135/79  Pulse: 68    General Examination: The patient is a very pleasant 78 y.o. male in no acute distress. He appears well-developed and well-nourished and well groomed.   HEENT: Normocephalic, atraumatic, pupils are equal, round and reactive to light and accommodation. Funduscopic exam is difficult due to bilateral cataracts noted.  He is wearing eyeglasses.  No photophobia.  Hearing appears mildly impaired.  Extraocular tracking is well-preserved.  No obvious nystagmus noted.  Face is symmetric with normal facial animation. Speech is clear with no dysarthria noted. There is no hypophonia. There is no lip, neck/head, jaw or voice tremor. Neck is supple with full range of passive and active motion. There are no carotid bruits on auscultation. Oropharynx exam reveals: mild to  moderate mouth dryness, edentulous on top and few teeth on the bottom and mild airway crowding secondary to smaller airway entry, redundant  soft palate and tonsillar size of 1+.  Tongue protrudes centrally and palate elevates symmetrically.   Chest: Clear to auscultation without wheezing, rhonchi or crackles noted.   Heart: S1+S2+0, regular and normal without murmurs, rubs or gallops noted.    Abdomen: Soft, non-tender and non-distended with normal bowel sounds appreciated on auscultation.   Extremities: There is no pitting edema in the distal lower extremities bilaterally.    Skin: Warm and dry without trophic changes noted. There are no varicose veins.   Musculoskeletal: exam reveals no obvious joint deformities.    Neurologically:  Mental status: The patient is awake, alert and oriented in all 4 spheres. His immediate and remote memory, attention, language skills and fund of knowledge are fair, he is not very detailed with his history.  His daughter supplements his history some.   Speech is clear with normal prosody and enunciation. Thought process is linear. Mood is normal and affect is normal.      07/23/2023    2:16 PM  MMSE - Mini Mental State Exam  Orientation to time 2  Orientation to Place 5  Registration 3  Attention/ Calculation 1  Recall 0  Language- name 2 objects 2  Language- repeat 1  Language- follow 3 step command 2  Language- read & follow direction 0  Write a sentence 1  Copy design 1  Total score 18   On 07/23/2023: AFT: 6/min.  Cranial nerves II - XII are as described above under HEENT exam. In addition: shoulder shrug is normal with equal shoulder height noted. Motor exam: Normal bulk, strength and tone is noted. There is no obvious action or resting tremor.   Reflexes are 1+ throughout. Fine motor skills are grossly intact.   Cerebellar testing: No dysmetria or intention tremor. There is no truncal or gait ataxia.  Sensory exam: intact to light touch.   Gait, station and balance: He stands easily. No veering to one side is noted. No leaning to one side is noted. Posture is age-appropriate and stance is narrow based. Gait shows normal stride length and normal pace. No problems turning are noted.             Assessment and Plan:    In summary, KYDEN POTASH is a 78 year old male with an underlying medical history of left carotid artery stenosis, bradycardia, aortic atherosclerosis, reflux disease, hypertension, hyperlipidemia, obstructive sleep apnea, prostate cancer, mildly overweight state and recurrent headaches, who presents for evaluation of his memory loss of several months duration.  His memory scores are in the moderately abnormal range.  He has multiple vascular risk factors, no strong family history of memory loss and Alzheimer's dementia is less likely, vascular dementia certainly a possibility.  This was an extended visit of over 60 minutes with copious record review involved and addressing multiple questions, explaining test results as well.    I would like to proceed with additional diagnostic testing to evaluate his memory loss.   Below is a summary of my recommendations and our discussion points from today's visit.  The patient and his daughter Dorene Grebe on speaker phone were given these instructions in detail verbally and also in his MyChart after visit summary:   << Please pursue a formal eye examination, I recommend a full, dilated eye exam with any optometrist or ophthalmologist of your choosing. Blood work (which we will do today).  We will do a brain scan, called MRI and call you with the test results. We will have to schedule you  for this on a separate date. This test requires authorization from your insurance, and we will take care of the insurance process. We will repeat your evaluation for obstructive sleep apnea with a home sleep test.  We will consider treatment with an AutoPap machine if you still have obstructive sleep  apnea. We may consider medication for memory loss.   We will keep you posted as to your test results by phone call for now.  We will plan a follow-up after testing.   I recommend you increase your water intake to 6-8 cups of water per day, 8 oz size each.  I understand that your daughters had several questions today, most of which we addressed during the visit.  I would be happy to address additional questions at the next visit. Please feel free to also contact us in the interim by phone call or MyChart messaging for additional questions you may have.  I do not recommend that you drive any longer, you indicate that you have not driven a car in some years.>>  I plan to see him back soon after testing.  I answered his and his daughter's several questions to the best of my ability today and encouraged his daughter to be in touch via MyChart.  His daughter Dorene Grebe will be encouraged to also accompany him for his next appointment which may be more fruitful for them.    We will keep him posted as to his test results by phone call and/or MyChart messaging as well.   Thank you very much for allowing me to participate in the care of this nice patient. If I can be of any further assistance to you please do not hesitate to call me at [phone].   Sincerely,     Huston Foley, MD, PhD  Addendum, 07/28/2023: Patient's daughter, Carlena Sax, indicated that patient is already scheduled for a brain MRI as ordered by a different provider.  I will cancel his brain MRI request that I placed.

## 2023-07-23 NOTE — Telephone Encounter (Signed)
Pt's daughter, Deaaron Payant said have some concerns about the flow of the appointment today. Asking if allowance to ask questions would be appreciated.

## 2023-07-23 NOTE — Telephone Encounter (Signed)
Patient's daughter James [name] was on speaker phone and also communicated with her sister during the appointment via messaging.  They had several questions which I answered to the best of my abilities and they were encouraged to ask follow-up questions via MyChart as well.  Please encourage patient's daughter James [name] or her sister to join [place] for the next visit in person which may be more fruitful.  We will plan to follow-up after testing.  Testing was explained at the time, we will do blood work today, and I will order a brain MRI as well as a home sleep test for reevaluation of his OSA.  If he has obstructive sleep apnea I would recommend treatment with an AutoPap machine.  If there are any medical treatments needed based on today's blood work, he will be advised to follow-up with his PCP.  We will keep him posted as to his test results by phone call.  He is advised to see an eye doctor for a full eye examination as he may not have seen an eye doctor in years.  He was advised to increase his water intake.  Details instructions were given verbally while patient's daughter was still on the phone and also in writing in his MyChart after visit summary.

## 2023-07-23 NOTE — Patient Instructions (Addendum)
It was nice to see you today.  You have complaints of memory loss: memory loss or changes in cognitive function can have many reasons and does not always mean you have dementia.  There are several conditions and situations that can contribute to subjective or objective memory loss.  These factors include: depression, stress, sleep deprivation or poor sleep from insomnia or sleep apnea, dehydration, fluctuation in blood sugar values, thyroid or electrolyte dysfunction, medication effects from sedating medications or narcotic pain medication for example and certain vitamin deficiencies such as vitamin B12 deficiency, and anemia. Dementia can be caused by stroke, brain atherosclerosis or brain vascular disease due to vascular risk factors (smoking, high blood pressure, high cholesterol, obesity and uncontrolled diabetes), certain degenerative brain disorders (including Parkinson's disease and Multiple sclerosis) and by Alzheimer's disease or other, more rare and sometimes hereditary causes.   Here is what I would recommend:   Please pursue a formal eye examination, I recommend a full, dilated eye exam with any optometrist or ophthalmologist of your choosing. Blood work (which we will do today).  We will do a brain scan, called MRI and call you with the test results. We will have to schedule you for this on a separate date. This test requires authorization from your insurance, and we will take care of the insurance process. We will repeat your evaluation for obstructive sleep apnea with a home sleep test.  We will consider treatment with an AutoPap machine if you still have obstructive sleep apnea. We may consider medication for memory loss.   We will keep you posted as to your test results by phone call for now.  We will plan a follow-up after testing.   I recommend you increase your water intake to 6-8 cups of water per day, 8 oz size each.  I understand that your daughters had several questions today, most  of which we addressed during the visit.  I would be happy to address additional questions at the next visit. Please feel free to also contact us in the interim by phone call or MyChart messaging for additional questions you may have.  I do not recommend that you drive any longer, you indicate that you have not driven a car in some years.

## 2023-07-25 ENCOUNTER — Other Ambulatory Visit (HOSPITAL_COMMUNITY): Payer: Self-pay

## 2023-07-25 ENCOUNTER — Other Ambulatory Visit: Payer: Self-pay

## 2023-07-27 ENCOUNTER — Encounter: Payer: Self-pay | Admitting: Neurology

## 2023-07-28 ENCOUNTER — Encounter: Payer: Self-pay | Admitting: Neurology

## 2023-07-28 NOTE — Telephone Encounter (Signed)
See MyChart response to [employer].

## 2023-07-28 NOTE — Telephone Encounter (Signed)
Noted. Pt's daughter Carlena Sax sent a Clinical cytogeneticist message which has been responded to.

## 2023-07-28 NOTE — Telephone Encounter (Signed)
Noted thank you

## 2023-07-28 NOTE — Addendum Note (Signed)
Addended by: Huston Foley on: 07/28/2023 12:30 PM   Modules accepted: Orders

## 2023-07-29 LAB — COMPREHENSIVE METABOLIC PANEL
ALT: 20 [IU]/L (ref 0–44)
AST: 28 [IU]/L (ref 0–40)
Albumin: 4.3 g/dL (ref 3.8–4.8)
Alkaline Phosphatase: 100 [IU]/L (ref 44–121)
BUN/Creatinine Ratio: 8 — ABNORMAL LOW (ref 10–24)
BUN: 10 mg/dL (ref 8–27)
Bilirubin Total: 0.3 mg/dL (ref 0.0–1.2)
CO2: 21 mmol/L (ref 20–29)
Calcium: 10.1 mg/dL (ref 8.6–10.2)
Chloride: 99 mmol/L (ref 96–106)
Creatinine, Ser: 1.2 mg/dL (ref 0.76–1.27)
Globulin, Total: 3.5 g/dL (ref 1.5–4.5)
Glucose: 83 mg/dL (ref 70–99)
Potassium: 4.2 mmol/L (ref 3.5–5.2)
Sodium: 138 mmol/L (ref 134–144)
Total Protein: 7.8 g/dL (ref 6.0–8.5)
eGFR: 63 mL/min/{1.73_m2} (ref >59)

## 2023-07-29 LAB — IRON AND TIBC
Iron Saturation: 12 % — ABNORMAL LOW (ref 15–55)
Iron: 42 ug/dL (ref 38–169)
Total Iron Binding Capacity: 344 ug/dL (ref 250–450)
UIBC: 302 ug/dL (ref 111–343)

## 2023-07-29 LAB — CBC WITH DIFFERENTIAL/PLATELET
Basophils Absolute: 0 10*3/uL (ref 0.0–0.2)
Basos: 1 %
EOS (ABSOLUTE): 0.2 10*3/uL (ref 0.0–0.4)
Eos: 4 %
Hematocrit: 45.6 % (ref 37.5–51.0)
Hemoglobin: 15.2 g/dL (ref 13.0–17.7)
Immature Grans (Abs): 0 10*3/uL (ref 0.0–0.1)
Immature Granulocytes: 0 %
Lymphocytes Absolute: 1.9 10*3/uL (ref 0.7–3.1)
Lymphs: 37 %
MCH: 31.3 pg (ref 26.6–33.0)
MCHC: 33.3 g/dL (ref 31.5–35.7)
MCV: 94 fL (ref 79–97)
Monocytes Absolute: 0.5 10*3/uL (ref 0.1–0.9)
Monocytes: 9 %
Neutrophils Absolute: 2.5 10*3/uL (ref 1.4–7.0)
Neutrophils: 49 %
Platelets: 177 10*3/uL (ref 150–450)
RBC: 4.86 x10E6/uL (ref 4.14–5.80)
RDW: 13.1 % (ref 11.6–15.4)
WBC: 5.2 10*3/uL (ref 3.4–10.8)

## 2023-07-29 LAB — B12 AND FOLATE PANEL
Folate: 19.3 ng/mL (ref >3.0)
Vitamin B-12: 1264 pg/mL — ABNORMAL HIGH (ref 232–1245)

## 2023-07-29 LAB — HGB A1C W/O EAG: Hgb A1c MFr Bld: 5.4 % (ref 4.8–5.6)

## 2023-07-29 LAB — ANA W/REFLEX: Anti Nuclear Antibody (ANA): NEGATIVE

## 2023-07-29 LAB — VITAMIN B1: Thiamine: 110.6 nmol/L (ref 66.5–200.0)

## 2023-07-29 LAB — VITAMIN B6: Vitamin B6: 24.6 ug/L (ref 3.4–65.2)

## 2023-07-29 LAB — VITAMIN D 25 HYDROXY (VIT D DEFICIENCY, FRACTURES): Vit D, 25-Hydroxy: 57.3 ng/mL (ref 30.0–100.0)

## 2023-07-29 LAB — TSH: TSH: 2.37 u[IU]/mL (ref 0.450–4.500)

## 2023-07-30 ENCOUNTER — Telehealth: Payer: Self-pay | Admitting: Neurology

## 2023-07-30 NOTE — Telephone Encounter (Signed)
[employer] pending uploaded notes.

## 2023-08-04 NOTE — Telephone Encounter (Signed)
 Checked status on the portal it is still pending.

## 2023-08-05 NOTE — Telephone Encounter (Signed)
[employer]: [id number] (exp. 07/30/23 to 11/02/23)

## 2023-08-13 ENCOUNTER — Encounter: Payer: Self-pay | Admitting: Hematology and Oncology

## 2023-09-01 NOTE — Telephone Encounter (Signed)
 Patient daughter Carlena Sax called and left a voicemail that she was canceling patient SS appt. She stated they will call back to rs.

## 2023-09-15 ENCOUNTER — Other Ambulatory Visit: Payer: Self-pay | Admitting: *Deleted

## 2023-09-15 DIAGNOSIS — C61 Malignant neoplasm of prostate: Secondary | ICD-10-CM

## 2023-09-15 MED ORDER — ENZALUTAMIDE 40 MG PO TABS: 160.0000 mg | ORAL_TABLET | Freq: Every day | ORAL | 2 refills | Status: DC

## 2023-10-23 ENCOUNTER — Encounter: Payer: Self-pay | Admitting: Hematology and Oncology

## 2023-10-23 ENCOUNTER — Telehealth: Payer: Self-pay | Admitting: Hematology and Oncology

## 2023-11-10 ENCOUNTER — Encounter: Payer: Self-pay | Admitting: Hematology and Oncology

## 2023-11-11 ENCOUNTER — Other Ambulatory Visit: Payer: Self-pay | Admitting: Physician Assistant

## 2023-11-11 DIAGNOSIS — D509 Iron deficiency anemia, unspecified: Secondary | ICD-10-CM

## 2023-11-11 DIAGNOSIS — C61 Malignant neoplasm of prostate: Secondary | ICD-10-CM

## 2023-11-12 ENCOUNTER — Inpatient Hospital Stay: Payer: Self-pay | Attending: Hematology

## 2023-11-12 ENCOUNTER — Inpatient Hospital Stay (HOSPITAL_BASED_OUTPATIENT_CLINIC_OR_DEPARTMENT_OTHER): Payer: Self-pay | Admitting: Physician Assistant

## 2023-11-12 ENCOUNTER — Inpatient Hospital Stay: Payer: Self-pay

## 2023-11-12 VITALS — BP 124/75 | HR 66 | Temp 98.2°F | Resp 13 | Wt 179.6 lb

## 2023-11-12 DIAGNOSIS — C61 Malignant neoplasm of prostate: Secondary | ICD-10-CM

## 2023-11-12 DIAGNOSIS — E785 Hyperlipidemia, unspecified: Secondary | ICD-10-CM | POA: Diagnosis not present

## 2023-11-12 DIAGNOSIS — R634 Abnormal weight loss: Secondary | ICD-10-CM | POA: Insufficient documentation

## 2023-11-12 DIAGNOSIS — Z87891 Personal history of nicotine dependence: Secondary | ICD-10-CM | POA: Insufficient documentation

## 2023-11-12 DIAGNOSIS — I10 Essential (primary) hypertension: Secondary | ICD-10-CM | POA: Diagnosis not present

## 2023-11-12 DIAGNOSIS — Z79818 Long term (current) use of other agents affecting estrogen receptors and estrogen levels: Secondary | ICD-10-CM | POA: Diagnosis not present

## 2023-11-12 DIAGNOSIS — Z8619 Personal history of other infectious and parasitic diseases: Secondary | ICD-10-CM | POA: Diagnosis not present

## 2023-11-12 DIAGNOSIS — D509 Iron deficiency anemia, unspecified: Secondary | ICD-10-CM | POA: Insufficient documentation

## 2023-11-12 DIAGNOSIS — Z79899 Other long term (current) drug therapy: Secondary | ICD-10-CM | POA: Diagnosis not present

## 2023-11-12 DIAGNOSIS — K219 Gastro-esophageal reflux disease without esophagitis: Secondary | ICD-10-CM | POA: Insufficient documentation

## 2023-11-12 DIAGNOSIS — Z9049 Acquired absence of other specified parts of digestive tract: Secondary | ICD-10-CM | POA: Insufficient documentation

## 2023-11-12 LAB — IRON AND IRON BINDING CAPACITY (CC-WL,HP ONLY)
Iron: 46 ug/dL (ref 45–182)
Saturation Ratios: 13 % — ABNORMAL LOW (ref 17.9–39.5)
TIBC: 353 ug/dL (ref 250–450)
UIBC: 307 ug/dL (ref 117–376)

## 2023-11-12 LAB — CMP (CANCER CENTER ONLY)
ALT: 16 U/L (ref 0–44)
AST: 25 U/L (ref 15–41)
Albumin: 4 g/dL (ref 3.5–5.0)
Alkaline Phosphatase: 73 U/L (ref 38–126)
Anion gap: 6 (ref 5–15)
BUN: 24 mg/dL — ABNORMAL HIGH (ref 8–23)
CO2: 23 mmol/L (ref 22–32)
Calcium: 9.7 mg/dL (ref 8.9–10.3)
Chloride: 111 mmol/L (ref 98–111)
Creatinine: 1.09 mg/dL (ref 0.61–1.24)
GFR, Estimated: >60 mL/min (ref >60)
Glucose, Bld: 102 mg/dL — ABNORMAL HIGH (ref 70–99)
Potassium: 4.2 mmol/L (ref 3.5–5.1)
Sodium: 140 mmol/L (ref 135–145)
Total Bilirubin: 0.3 mg/dL (ref 0.0–1.2)
Total Protein: 7.7 g/dL (ref 6.5–8.1)

## 2023-11-12 LAB — CBC WITH DIFFERENTIAL (CANCER CENTER ONLY)
Abs Immature Granulocytes: 0.01 10*3/uL (ref 0.00–0.07)
Basophils Absolute: 0.1 10*3/uL (ref 0.0–0.1)
Basophils Relative: 1 %
Eosinophils Absolute: 0.2 10*3/uL (ref 0.0–0.5)
Eosinophils Relative: 4 %
HCT: 39.4 % (ref 39.0–52.0)
Hemoglobin: 13.8 g/dL (ref 13.0–17.0)
Immature Granulocytes: 0 %
Lymphocytes Relative: 37 %
Lymphs Abs: 2 10*3/uL (ref 0.7–4.0)
MCH: 32.5 pg (ref 26.0–34.0)
MCHC: 35 g/dL (ref 30.0–36.0)
MCV: 92.7 fL (ref 80.0–100.0)
Monocytes Absolute: 0.6 10*3/uL (ref 0.1–1.0)
Monocytes Relative: 12 %
Neutro Abs: 2.4 10*3/uL (ref 1.7–7.7)
Neutrophils Relative %: 46 %
Platelet Count: 158 10*3/uL (ref 150–400)
RBC: 4.25 MIL/uL (ref 4.22–5.81)
RDW: 13.6 % (ref 11.5–15.5)
WBC Count: 5.3 10*3/uL (ref 4.0–10.5)
nRBC: 0 % (ref 0.0–0.2)

## 2023-11-12 MED ORDER — LEUPROLIDE ACETATE (3 MONTH) 22.5 MG IM KIT
22.5000 mg | PACK | Freq: Once | INTRAMUSCULAR | Status: AC
Start: 2023-11-12 — End: 2023-11-12
  Administered 2023-11-12: 22.5 mg via INTRAMUSCULAR
  Filled 2023-11-12: qty 22.5

## 2023-11-12 NOTE — Progress Notes (Signed)
 [facility] [phone]   Fax:([phone]  PROGRESS NOTE  Patient Care Team: Trellis Fries, MD as PCP - General (Family Medicine)  Hematological/Oncological History # Castration Sensitive Advance Prostate Cancer 2013: radiation therapy in [place]  March 2016: prostate biopsy showed a Gleason score 4+3 = 7 as well as a another foci of Gleason score 4+4 = 8. He underwent a repeat cryotherapy  12/01/2015: PSA went up to 2.96 and a PET scan obtained, showed marked hypermetabolic right pelvic sidewall lymph node consistent with metastatic disease  12/15/2015: biopsy confirmed the presence of metastatic prostate cancer  July 2017: started zytiga  1000 mg  03/14/2022: last visit with Dr. Dirk Fredericks  06/12/2022: Transition to enzalutamide  160 mg p.o. daily due to insurance issues. 08/14/2022: transition care to Dr. Rosaline Coma   Interval History:  James [name] 78 y.o. male with medical history significant for [facility] who presents for a follow up visit. The patient's last visit was on 04/04/2023. In the interim since the last visit he has continued on enzalutamide  and lupron  injections. He is accompanied by his daughter, James [name], for this visit.   On exam today [name] reports his energy levels are fairly stable.  He is able to complete his daily activities on his own.  He reports that his appetite has decreased some with a noted weight loss of 7 point lbs since beginning of the year. He denies any nausea, vomiting or bowel habit changes. He denies any signs of bleeding including hematochezia or melena. He denies any new bone or back pain.  He otherwise denies any fevers, chills, sweats, shortness of breath, chest pain or cough.  A full 10 point ROS was otherwise negative.  MEDICAL HISTORY:  Past Medical History:  Diagnosis Date   GERD (gastroesophageal reflux disease)    Hyperlipidemia    Hypertension    Nocturia    Recurrent prostate adenocarcinoma (HCC)    first dx'd-- 2010   post external beam radiation--  recurrent march 2015  s/p cryoablation (in [place] )   Wears glasses     SURGICAL HISTORY: Past Surgical History:  Procedure Laterality Date   APPENDECTOMY  1970   COLONOSCOPY  last one 2014   CRYOABLATION N/A 12/05/2014   Procedure: CRYO ABLATION PROSTATE;  Surgeon: Annamarie Kid, MD;  Location: [facility];  Service: Urology;  Laterality: N/A;   PROSTATE CRYOABLATION  March 2015  ([place] )   REPAIR RIGHT RING AND LITTLE FINGERS  07/01/2012    post numbness residual    SOCIAL HISTORY: Social History   Socioeconomic History   Marital status: Single    Spouse name: Not on file   Number of children: Not on file   Years of education: Not on file   Highest education level: Not on file  Occupational History   Not on file  Tobacco Use   Smoking status: Former    Current packs/day: 0.00    Average packs/day: 1 pack/day for 40.0 years (40.0 ttl pk-yrs)    Types: Cigarettes    Start date: 11/29/1972    Quit date: 11/29/2012    Years since quitting: 10.9   Smokeless tobacco: Never  Substance and Sexual Activity   Alcohol use: No   Drug use: No   Sexual activity: Not on file  Other Topics Concern   Not on file  Social History Narrative   Not on file   Social Drivers of Health   Financial Resource Strain: Not on file  Food Insecurity: Not on  file  Transportation Needs: Not on file  Physical Activity: Not on file  Stress: Not on file  Social Connections: Not on file  Intimate Partner Violence: Not on file    FAMILY HISTORY: Family History  Problem Relation Age of Onset   Stroke Sister    Hyperlipidemia Sister    Hypertension Sister    Diabetes type II Sister     ALLERGIES:  has no known allergies.  MEDICATIONS:  Current Outpatient Medications  Medication Sig Dispense Refill   amLODipine  (NORVASC ) 10 MG tablet Take 10 mg by mouth daily.     atorvastatin  (LIPITOR) 40 MG tablet      enzalutamide  (XTANDI ) 40  MG tablet Take 4 tablets (160 mg total) by mouth daily. 120 tablet 2   ferrous sulfate  325 (65 FE) MG EC tablet Take 1 tablet (325 mg total) by mouth 2 (two) times daily. (Patient taking differently: Take 325 mg by mouth daily.) 60 tablet 3   ibuprofen  (ADVIL ) 200 MG tablet Take 200 mg by mouth every 6 (six) hours as needed for headache.     metoprolol  succinate (TOPROL -XL) 25 MG 24 hr tablet Take 25 mg by mouth daily.     oxybutynin  (DITROPAN -XL) 10 MG 24 hr tablet Take 1 tablet (10 mg total) by mouth at bedtime. ER 30 tablet 3   No current facility-administered medications for this visit.    REVIEW OF SYSTEMS:   Constitutional: ( - ) fevers, ( - )  chills , ( - ) night sweats Eyes: ( - ) blurriness of vision, ( - ) double vision, ( - ) watery eyes Ears, nose, mouth, throat, and face: ( - ) mucositis, ( - ) sore throat Respiratory: ( - ) cough, ( - ) dyspnea, ( - ) wheezes Cardiovascular: ( - ) palpitation, ( - ) chest discomfort, ( - ) lower extremity swelling Gastrointestinal:  ( - ) nausea, ( - ) heartburn, ( - ) change in bowel habits Skin: ( - ) abnormal skin rashes Lymphatics: ( - ) new lymphadenopathy, ( - ) easy bruising Neurological: ( - ) numbness, ( - ) tingling, ( - ) new weaknesses Behavioral/Psych: ( - ) mood change, ( - ) new changes  All other systems were reviewed with the patient and are negative.  PHYSICAL EXAMINATION: Vitals:   11/12/23 1357  BP: 124/75  Pulse: 66  Resp: 13  Temp: 98.2 F (36.8 C)  SpO2: 100%    Filed Weights   11/12/23 1357  Weight: 179 lb 9.6 oz (81.5 kg)     GENERAL: Well-appearing elderly African-American male, alert, no distress and comfortable SKIN: skin color, texture, turgor are normal, no rashes or significant lesions EYES: conjunctiva are pink and non-injected, sclera clear LUNGS: clear to auscultation and percussion with normal breathing effort HEART: regular rate & rhythm and no murmurs and no lower extremity  edema Musculoskeletal: no cyanosis of digits and no clubbing  PSYCH: alert & oriented x 3, fluent speech NEURO: no focal motor/sensory deficits  LABORATORY DATA:  I have reviewed the data as listed    Latest Ref Rng & Units 11/12/2023    1:18 PM 07/23/2023    3:44 PM 04/04/2023   11:15 AM  CBC  WBC 4.0 - 10.5 K/uL 5.3  5.2  4.5   Hemoglobin 13.0 - 17.0 g/dL 16.1  09.6  04.5   Hematocrit 39.0 - 52.0 % 39.4  45.6  43.4   Platelets 150 - 400 K/uL 158  177  157        Latest Ref Rng & Units 11/12/2023    1:18 PM 07/23/2023    3:44 PM 04/04/2023   11:15 AM  CMP  Glucose 70 - 99 mg/dL 332  83  91   BUN 8 - 23 mg/dL 24  10  21    Creatinine 0.61 - 1.24 mg/dL 9.51  8.84  1.66   Sodium 135 - 145 mmol/L 140  138  138   Potassium 3.5 - 5.1 mmol/L 4.2  4.2  4.1   Chloride 98 - 111 mmol/L 111  99  106   CO2 22 - 32 mmol/L 23  21  25    Calcium 8.9 - 10.3 mg/dL 9.7  06.3  01.6   Total Protein 6.5 - 8.1 g/dL 7.7  7.8  8.3   Total Bilirubin 0.0 - 1.2 mg/dL 0.3  0.3  0.5   Alkaline Phos 38 - 126 U/L 73  100  74   AST 15 - 41 U/L 25  28  25    ALT 0 - 44 U/L 16  20  17     RADIOGRAPHIC STUDIES: No results found.  ASSESSMENT & PLAN James [name] is a 77 y.o. male with medical history significant for [facility] who presents for a follow up visit.  # Castration Sensitive Advance Prostate Cancer --lab today reviewed with patient.  WBC 5.3, hemoglobin 13.8, platelet 158, creatinine and LFTs are normal.  --Most recent PSA from 04/04/2023 was < 0.1. Today's level is pending.  --continue enzalutamide  160 mg PO daily  --continue leuprolide  22.5 mg q. 3 months, due today. --RTC in 3 months with labs/followup  #Iron deficiency anemia: --Etiology unknown. Patient denies any overt signs of bleeding. He does have history of H.pylori infection and has completed therapy.  --Labs today show no anemia with Hgb 13.8. Iron panel shows saturation 13%, ferritin levels pending. --No need for IV iron at this time.   --Currently taking PO ferrous sulfate  325 mg PO daily. Advised to continue.    No orders of the defined types were placed in this encounter.   All questions were answered. The patient knows to call the clinic with any problems, questions or concerns.  I have spent a total of 30 minutes minutes of face-to-face and non-face-to-face time, preparing to see the patient, performing a medically appropriate examination, counseling and educating the patient,  documenting clinical information in the electronic health record, and care coordination.   Wyline Hearing PA-C Dept of Hematology and Oncology [facility] Phone: [phone]     11/12/2023 8:58 PM

## 2023-11-13 ENCOUNTER — Ambulatory Visit: Payer: Self-pay | Admitting: Physician Assistant

## 2023-11-13 ENCOUNTER — Telehealth: Payer: Self-pay | Admitting: Hematology and Oncology

## 2023-11-13 LAB — PROSTATE-SPECIFIC AG, SERUM (LABCORP): Prostate Specific Ag, Serum: <0.1 ng/mL (ref 0.0–4.0)

## 2023-11-13 LAB — TESTOSTERONE: Testosterone: 40 ng/dL — ABNORMAL LOW (ref 264–916)

## 2023-11-13 LAB — FERRITIN: Ferritin: 120 ng/mL (ref 24–336)

## 2024-03-29 ENCOUNTER — Telehealth: Payer: Self-pay | Admitting: Physician Assistant

## 2024-03-29 NOTE — Telephone Encounter (Signed)
 Scheduled appointments with the patients daughter per secure chat.

## 2024-04-05 NOTE — Progress Notes (Unsigned)
 [facility] OFFICE PROGRESS NOTE  James Francisco, MD [address] [place] [address]  DIAGNOSIS:  # Castration Sensitive Advance Prostate Cancer 2013: radiation therapy in [place]  March 2016: prostate biopsy showed a Gleason score 4+3 = 7 as well as a another foci of Gleason score 4+4 = 8. He underwent a repeat cryotherapy  12/01/2015: PSA went up to 2.96 and a PET scan obtained, showed marked hypermetabolic right pelvic sidewall lymph node consistent with metastatic disease  12/15/2015: biopsy confirmed the presence of metastatic prostate cancer  July 2017: started zytiga  1000 mg  03/14/2022: last visit with Dr. Amadeo  06/12/2022: Transition to enzalutamide  160 mg p.o. daily due to insurance issues. 08/14/2022: transition care to Dr. Federico   CURRENT THERAPY: Lupron  injection and enzalutamide    INTERVAL HISTORY: James [name] 78 y.o. male returns to clinic today for follow-up visit.  The patient was last seen in the clinic on 11/12/2023.  The patient is followed for his history of prostate cancer.  He is currently receiving Lupron  injections every***months.  And he also was on enzalutamide  she tolerates well.  He also takes ferrous sulfate  for IDA. He denies any major changes in his health since he was last seen.  He reports his energy is stable.  He is independent with his activities of daily living.  He denies any fever, chills, night sweats, or unexplained weight loss.  Appetite?  Weight loss?  He denies any nausea, vomiting, diarrhea, constipation.  He denies any urinary changes.  Denies any unusual abdominal pain or back pain.  He denies any abnormal bleeding or bruising.  He denies any unusual bone pain.  He denies any shortness of breath, cough, or chest pain.  He is here today for evaluation and repeat blood work. MEDICAL HISTORY: Past Medical History:  Diagnosis Date   GERD (gastroesophageal reflux disease)    Hyperlipidemia    Hypertension    Nocturia    Recurrent  prostate adenocarcinoma (HCC)    first dx'd-- 2010  post external beam radiation--  recurrent march 2015  s/p cryoablation (in [place] )   Wears glasses     ALLERGIES:  has no known allergies.  MEDICATIONS:  Current Outpatient Medications  Medication Sig Dispense Refill   amLODipine  (NORVASC ) 10 MG tablet Take 10 mg by mouth daily.     atorvastatin  (LIPITOR) 40 MG tablet      enzalutamide  (XTANDI ) 40 MG tablet Take 4 tablets (160 mg total) by mouth daily. 120 tablet 2   ferrous sulfate  325 (65 FE) MG EC tablet Take 1 tablet (325 mg total) by mouth 2 (two) times daily. (Patient taking differently: Take 325 mg by mouth daily.) 60 tablet 3   ibuprofen  (ADVIL ) 200 MG tablet Take 200 mg by mouth every 6 (six) hours as needed for headache.     metoprolol  succinate (TOPROL -XL) 25 MG 24 hr tablet Take 25 mg by mouth daily.     oxybutynin  (DITROPAN -XL) 10 MG 24 hr tablet Take 1 tablet (10 mg total) by mouth at bedtime. ER 30 tablet 3   No current facility-administered medications for this visit.    SURGICAL HISTORY:  Past Surgical History:  Procedure Laterality Date   APPENDECTOMY  1970   COLONOSCOPY  last one 2014   CRYOABLATION N/A 12/05/2014   Procedure: CRYO ABLATION PROSTATE;  Surgeon: Arlena Gal, MD;  Location: [facility];  Service: Urology;  Laterality: N/A;   PROSTATE CR[place]OABLATION  March 2015  ([place] )   REPAIR  RIGHT RING AND LITTLE FINGERS  07/01/2012    post numbness residual    REVIEW OF SYSTEMS:   Review of Systems  Constitutional: Negative for appetite change, chills, fatigue, fever and unexpected weight change.  HENT:   Negative for mouth sores, nosebleeds, sore throat and trouble swallowing.   Eyes: Negative for eye problems and icterus.  Respiratory: Negative for cough, hemoptysis, shortness of breath and wheezing.   Cardiovascular: Negative for chest pain and leg swelling.  Gastrointestinal: Negative for abdominal pain, constipation,  diarrhea, nausea and vomiting.  Genitourinary: Negative for bladder incontinence, difficulty urinating, dysuria, frequency and hematuria.   Musculoskeletal: Negative for back pain, gait problem, neck pain and neck stiffness.  Skin: Negative for itching and rash.  Neurological: Negative for dizziness, extremity weakness, gait problem, headaches, light-headedness and seizures.  Hematological: Negative for adenopathy. Does not bruise/bleed easily.  Psychiatric/Behavioral: Negative for confusion, depression and sleep disturbance. The patient is not nervous/anxious.     PHYSICAL EXAMINATION:  There were no vitals taken for this visit.  ECOG PERFORMANCE STATUS: {CHL ONC ECOG [id number]  Physical Exam  Constitutional: Oriented to person, place, and time and well-developed, well-nourished, and in no distress. No distress.  HENT:  Head: Normocephalic and atraumatic.  Mouth/Throat: Oropharynx is clear and moist. No oropharyngeal exudate.  Eyes: Conjunctivae are normal. Right eye exhibits no discharge. Left eye exhibits no discharge. No scleral icterus.  Neck: Normal range of motion. Neck supple.  Cardiovascular: Normal rate, regular rhythm, normal heart sounds and intact distal pulses.   Pulmonary/Chest: Effort normal and breath sounds normal. No respiratory distress. No wheezes. No rales.  Abdominal: Soft. Bowel sounds are normal. Exhibits no distension and no mass. There is no tenderness.  Musculoskeletal: Normal range of motion. Exhibits no edema.  Lymphadenopathy:    No cervical adenopathy.  Neurological: Alert and oriented to person, place, and time. Exhibits normal muscle tone. Gait normal. Coordination normal.  Skin: Skin is warm and dry. No rash noted. Not diaphoretic. No erythema. No pallor.  Psychiatric: Mood, memory and judgment normal.  Vitals reviewed.  LABORATORY DATA: Lab Results  Component Value Date   WBC 5.3 11/12/2023   HGB 13.8 11/12/2023   HCT 39.4 11/12/2023    MCV 92.7 11/12/2023   PLT 158 11/12/2023      Chemistry      Component Value Date/Time   NA 140 11/12/2023 1318   NA 138 07/23/2023 1544   NA 140 06/05/2017 1003   K 4.2 11/12/2023 1318   K 4.0 06/05/2017 1003   CL 111 11/12/2023 1318   CO2 23 11/12/2023 1318   CO2 27 06/05/2017 1003   BUN 24 (H) 11/12/2023 1318   BUN 10 07/23/2023 1544   BUN 25.2 06/05/2017 1003   CREATININE 1.09 11/12/2023 1318   CREATININE 1.3 06/05/2017 1003      Component Value Date/Time   CALCIUM 9.7 11/12/2023 1318   CALCIUM 9.9 06/05/2017 1003   ALKPHOS 73 11/12/2023 1318   ALKPHOS 78 06/05/2017 1003   AST 25 11/12/2023 1318   AST 35 (H) 06/05/2017 1003   ALT 16 11/12/2023 1318   ALT 63 (H) 06/05/2017 1003   BILITOT 0.3 11/12/2023 1318   BILITOT 0.37 06/05/2017 1003       RADIOGRAPHIC STUDIES:  No results found.   ASSESSMENT/PLAN:  James [name] is a 78 y.o. male with medical history significant for [facility] who presents for a follow up visit.   # Castration Sensitive Advance Prostate Cancer --lab today  reviewed with patient.  WBC ***, hemoglobin ***, platelet ***, creatinine and LFTs are ***.  --Most recent PSA from 11/12/23 was < 0.1. Today's level is pending.  --continue enzalutamide  160 mg PO daily  --continue leuprolide  22.5 mg q. 3 months, due today. --RTC in 3 months with labs/followup   #Iron deficiency anemia: --Etiology unknown. Patient denies any overt signs of bleeding. He does have history of H.pylori infection and has completed therapy.  --Labs today show no anemia with Hgb ***. Iron panel shows saturation ***%, ferritin levels pending. --No need for IV iron at this time.  --Currently taking PO ferrous sulfate  325 mg PO daily. Advised to continue.    No orders of the defined types were placed in this encounter.    I spent {CHL ONC TIME VISIT - DTPQU:[id number]} counseling the patient face to face. The total time spent in the appointment was {CHL ONC TIME VISIT -  DTPQU:[id number]}.  Ahtziri Jeffries L Laderius Valbuena, PA-C 04/05/24

## 2024-04-07 ENCOUNTER — Other Ambulatory Visit: Payer: Self-pay | Admitting: *Deleted

## 2024-04-07 DIAGNOSIS — C61 Malignant neoplasm of prostate: Secondary | ICD-10-CM

## 2024-04-07 MED ORDER — ENZALUTAMIDE 40 MG PO TABS: 160.0000 mg | ORAL_TABLET | Freq: Every day | ORAL | 0 refills | Status: DC

## 2024-04-08 ENCOUNTER — Inpatient Hospital Stay (HOSPITAL_BASED_OUTPATIENT_CLINIC_OR_DEPARTMENT_OTHER): Admitting: Physician Assistant

## 2024-04-08 ENCOUNTER — Inpatient Hospital Stay: Payer: Medicare (Managed Care) | Attending: Physician Assistant

## 2024-04-08 ENCOUNTER — Other Ambulatory Visit: Payer: Self-pay | Admitting: Medical Oncology

## 2024-04-08 ENCOUNTER — Other Ambulatory Visit: Payer: Self-pay | Admitting: Physician Assistant

## 2024-04-08 ENCOUNTER — Inpatient Hospital Stay: Payer: Medicare (Managed Care)

## 2024-04-08 DIAGNOSIS — Z191 Hormone sensitive malignancy status: Secondary | ICD-10-CM | POA: Insufficient documentation

## 2024-04-08 DIAGNOSIS — Z9049 Acquired absence of other specified parts of digestive tract: Secondary | ICD-10-CM | POA: Diagnosis not present

## 2024-04-08 DIAGNOSIS — E895 Postprocedural testicular hypofunction: Secondary | ICD-10-CM | POA: Diagnosis not present

## 2024-04-08 DIAGNOSIS — C775 Secondary and unspecified malignant neoplasm of intrapelvic lymph nodes: Secondary | ICD-10-CM | POA: Insufficient documentation

## 2024-04-08 DIAGNOSIS — I10 Essential (primary) hypertension: Secondary | ICD-10-CM | POA: Diagnosis not present

## 2024-04-08 DIAGNOSIS — Z8619 Personal history of other infectious and parasitic diseases: Secondary | ICD-10-CM | POA: Insufficient documentation

## 2024-04-08 DIAGNOSIS — C61 Malignant neoplasm of prostate: Secondary | ICD-10-CM | POA: Diagnosis not present

## 2024-04-08 DIAGNOSIS — K219 Gastro-esophageal reflux disease without esophagitis: Secondary | ICD-10-CM | POA: Diagnosis not present

## 2024-04-08 DIAGNOSIS — Z923 Personal history of irradiation: Secondary | ICD-10-CM | POA: Diagnosis not present

## 2024-04-08 DIAGNOSIS — D509 Iron deficiency anemia, unspecified: Secondary | ICD-10-CM | POA: Insufficient documentation

## 2024-04-08 DIAGNOSIS — R351 Nocturia: Secondary | ICD-10-CM | POA: Diagnosis not present

## 2024-04-08 DIAGNOSIS — Z79899 Other long term (current) drug therapy: Secondary | ICD-10-CM | POA: Insufficient documentation

## 2024-04-08 DIAGNOSIS — Z79818 Long term (current) use of other agents affecting estrogen receptors and estrogen levels: Secondary | ICD-10-CM | POA: Insufficient documentation

## 2024-04-08 LAB — CBC WITH DIFFERENTIAL (CANCER CENTER ONLY)
Abs Immature Granulocytes: 0.01 K/uL (ref 0.00–0.07)
Basophils Absolute: 0 K/uL (ref 0.0–0.1)
Basophils Relative: 1 %
Eosinophils Absolute: 0.2 K/uL (ref 0.0–0.5)
Eosinophils Relative: 3 %
HCT: 39.6 % (ref 39.0–52.0)
Hemoglobin: 13.6 g/dL (ref 13.0–17.0)
Immature Granulocytes: 0 %
Lymphocytes Relative: 35 %
Lymphs Abs: 1.5 K/uL (ref 0.7–4.0)
MCH: 31.7 pg (ref 26.0–34.0)
MCHC: 34.3 g/dL (ref 30.0–36.0)
MCV: 92.3 fL (ref 80.0–100.0)
Monocytes Absolute: 0.5 K/uL (ref 0.1–1.0)
Monocytes Relative: 12 %
Neutro Abs: 2.1 K/uL (ref 1.7–7.7)
Neutrophils Relative %: 49 %
Platelet Count: 190 K/uL (ref 150–400)
RBC: 4.29 MIL/uL (ref 4.22–5.81)
RDW: 14.8 % (ref 11.5–15.5)
WBC Count: 4.4 K/uL (ref 4.0–10.5)
nRBC: 0 % (ref 0.0–0.2)

## 2024-04-08 LAB — CMP (CANCER CENTER ONLY)
ALT: 14 U/L (ref 0–44)
AST: 20 U/L (ref 15–41)
Albumin: 4 g/dL (ref 3.5–5.0)
Alkaline Phosphatase: 73 U/L (ref 38–126)
Anion gap: 7 (ref 5–15)
BUN: 22 mg/dL (ref 8–23)
CO2: 27 mmol/L (ref 22–32)
Calcium: 10.6 mg/dL — ABNORMAL HIGH (ref 8.9–10.3)
Chloride: 105 mmol/L (ref 98–111)
Creatinine: 1.04 mg/dL (ref 0.61–1.24)
GFR, Estimated: >60 mL/min (ref >60)
Glucose, Bld: 106 mg/dL — ABNORMAL HIGH (ref 70–99)
Potassium: 4.4 mmol/L (ref 3.5–5.1)
Sodium: 139 mmol/L (ref 135–145)
Total Bilirubin: 0.4 mg/dL (ref 0.0–1.2)
Total Protein: 8.1 g/dL (ref 6.5–8.1)

## 2024-04-08 LAB — IRON AND IRON BINDING CAPACITY (CC-WL,HP ONLY)
Iron: 84 ug/dL (ref 45–182)
Saturation Ratios: 23 % (ref 17.9–39.5)
TIBC: 374 ug/dL (ref 250–450)
UIBC: 290 ug/dL (ref 117–376)

## 2024-04-08 LAB — PSA: Prostatic Specific Antigen: <0.02 ng/mL (ref 0.00–4.00)

## 2024-04-08 LAB — FERRITIN: Ferritin: 157 ng/mL (ref 24–336)

## 2024-04-08 MED ORDER — ENZALUTAMIDE 40 MG PO TABS: 160.0000 mg | ORAL_TABLET | Freq: Every day | ORAL | 1 refills | Status: DC

## 2024-04-08 MED ORDER — LEUPROLIDE ACETATE (3 MONTH) 22.5 MG IM KIT
22.5000 mg | PACK | Freq: Once | INTRAMUSCULAR | Status: AC
  Administered 2024-04-08: 22.5 mg via INTRAMUSCULAR
  Filled 2024-04-08: qty 22.5

## 2024-04-08 NOTE — Progress Notes (Signed)
 PSA ordered

## 2024-04-08 NOTE — Progress Notes (Signed)
 Psa ordered

## 2024-04-08 NOTE — Patient Instructions (Addendum)
-  We will review with Dr. Federico if he has a recommendation for another practice in [place], [place]  for you to establish with and we will fax your records. If you do not hear from the other practice to establish care, please call us  back so we can follow up to ensure the records were received.  -Please make sure you are not going to run out of Xtandi  prior to moving. I send refills. You would need to contact the specialtiy pharmacy to make sure they are sendign this to the correct address if your address will change.  [facility] - [place], [place] - [name] Phone: [phone]  Fax: [phone]    -Keep taking Xtandi  as prescribed.  -I am going to tentatively arrange a follow up in 3 months here for labs, follow up, and injection just in case you don't move on the timeline you had planned just to make sure you are getting the care you need.

## 2024-04-09 LAB — TESTOSTERONE: Testosterone: 71 ng/dL — ABNORMAL LOW (ref 264–916)

## 2024-04-13 ENCOUNTER — Encounter: Payer: Self-pay | Admitting: Hematology and Oncology

## 2024-04-15 ENCOUNTER — Telehealth: Payer: Self-pay

## 2024-04-15 ENCOUNTER — Other Ambulatory Visit: Payer: Self-pay

## 2024-04-15 ENCOUNTER — Encounter: Payer: Self-pay | Admitting: Physician Assistant

## 2024-04-15 DIAGNOSIS — C61 Malignant neoplasm of prostate: Secondary | ICD-10-CM

## 2024-04-15 NOTE — Telephone Encounter (Signed)
 Faxed new patient referral to [place]  [facility] with confirmation at [phone].

## 2024-04-26 ENCOUNTER — Telehealth: Payer: Self-pay

## 2024-04-26 NOTE — Telephone Encounter (Signed)
 Patient approved to be seen at new facility in [place] . Patient and his daughter have been notified of his upcoming appointment(s) with them.

## 2024-04-28 ENCOUNTER — Telehealth: Payer: Self-pay

## 2024-04-28 NOTE — Telephone Encounter (Signed)
 Oral Oncology Patient Advocate Encounter   Received notification that patient is due for re-enrollment for assistance for Xtandi  through [employer].   Re-enrollment requires new rx to be sent to [facility]. Approval scanned into documents.   Re-enrollment key: [id number]   [employer] phone number [phone].

## 2024-06-21 ENCOUNTER — Telehealth: Payer: Self-pay | Admitting: *Deleted

## 2024-06-21 ENCOUNTER — Other Ambulatory Visit: Payer: Self-pay | Admitting: *Deleted

## 2024-06-21 ENCOUNTER — Encounter: Payer: Self-pay | Admitting: Physician Assistant

## 2024-06-21 DIAGNOSIS — C61 Malignant neoplasm of prostate: Secondary | ICD-10-CM

## 2024-06-21 MED ORDER — ENZALUTAMIDE 40 MG PO TABS: 160.0000 mg | ORAL_TABLET | Freq: Every day | ORAL | 1 refills | Status: DC

## 2024-06-21 NOTE — Telephone Encounter (Signed)
 Received vm message from pt's daughter, requesting a refill for her father's Xtandi . He needs to be re-enrolled in the prescription financial assistance program for 2026. TCT [place] and spoke with her. She states that her father has re-located to [place] as of October and due to delays with transferring her insurance in [place], he has not seen an oncologist yet. She states he needs to be re-enrolled in the Xtandi  program for financial assistance. However, she states that his insurance will change in January. She states he has enough Xtandi  until 07/10/24. Advised that we will need the new insurance information for January before anything else can occur. Advised that once he gets established with his new oncologist, they will take over his Xtandi  and Lupron  injection orders. Advised we will do what we can in the meantime. James [name] voiced understanding.

## 2024-06-22 ENCOUNTER — Other Ambulatory Visit: Payer: Self-pay

## 2024-06-22 ENCOUNTER — Telehealth: Payer: Self-pay

## 2024-06-22 DIAGNOSIS — C61 Malignant neoplasm of prostate: Secondary | ICD-10-CM

## 2024-06-22 MED ORDER — ENZALUTAMIDE 80 MG PO TABS: 160.0000 mg | ORAL_TABLET | Freq: Every day | ORAL | 0 refills | Status: AC

## 2024-06-22 NOTE — Telephone Encounter (Signed)
 Attempted to contact patient to make aware that when refill of medication is received for the Xtandi , the dose of the individual tablet has increased to 80mg  as the 40mg  is on backorder, therefore to obtain the correct ordered dose of 160mg  he will only be taking 2 tablets instead of the 4 tablets as he was taking. The ordered dose of 160mg  is not changed per Cassie, PA note.

## 2024-06-22 NOTE — Telephone Encounter (Signed)
 Received message from pharmacy that the 40mg  are out of stock and on backorder for the pts Xtandi  medication. Requesting prescription for 80mg . New order sent.

## 2024-06-30 NOTE — Telephone Encounter (Signed)
 Oral Oncology Patient Advocate Encounter   Received notification that the application for assistance for Xtandi  through [employer]  has been approved.   [employer]  phone number [phone].   Effective dates: 07/01/24 through 06/30/25  Medication will be filled at [facility].  Lucie Lamer, CPhT [place]  [facility] Oncology Pharmacy Patient Advocate Specialist [facility] Phone: [phone]  Fax: [phone] [email or web address]
